# Patient Record
Sex: Male | Born: 1970 | Race: Black or African American | Hispanic: No | Marital: Single | Smoking: Never smoker
Health system: Southern US, Community
[De-identification: ages and names within clinical notes are randomized; demographics above are authoritative.]

## PROBLEM LIST (undated history)

## (undated) DIAGNOSIS — E119 Type 2 diabetes mellitus without complications: Secondary | ICD-10-CM

## (undated) DIAGNOSIS — I10 Essential (primary) hypertension: Secondary | ICD-10-CM

## (undated) DIAGNOSIS — E669 Obesity, unspecified: Secondary | ICD-10-CM

## (undated) HISTORY — PX: KNEE ARTHROSCOPY: SUR90

---

## 1998-05-24 ENCOUNTER — Emergency Department (HOSPITAL_COMMUNITY): Admission: EM | Admit: 1998-05-24 | Discharge: 1998-05-24 | Payer: Self-pay | Admitting: Emergency Medicine

## 1998-06-01 ENCOUNTER — Emergency Department (HOSPITAL_COMMUNITY): Admission: EM | Admit: 1998-06-01 | Discharge: 1998-06-01 | Payer: Self-pay | Admitting: *Deleted

## 2002-01-24 ENCOUNTER — Emergency Department (HOSPITAL_COMMUNITY): Admission: EM | Admit: 2002-01-24 | Discharge: 2002-01-24 | Payer: Self-pay | Admitting: Emergency Medicine

## 2002-01-24 ENCOUNTER — Encounter: Payer: Self-pay | Admitting: Emergency Medicine

## 2002-12-24 ENCOUNTER — Encounter: Payer: Self-pay | Admitting: Emergency Medicine

## 2002-12-24 ENCOUNTER — Emergency Department (HOSPITAL_COMMUNITY): Admission: EM | Admit: 2002-12-24 | Discharge: 2002-12-24 | Payer: Self-pay | Admitting: Emergency Medicine

## 2003-02-26 ENCOUNTER — Emergency Department (HOSPITAL_COMMUNITY): Admission: EM | Admit: 2003-02-26 | Discharge: 2003-02-26 | Payer: Self-pay | Admitting: Emergency Medicine

## 2003-05-13 ENCOUNTER — Emergency Department (HOSPITAL_COMMUNITY): Admission: EM | Admit: 2003-05-13 | Discharge: 2003-05-14 | Payer: Self-pay | Admitting: Emergency Medicine

## 2003-05-13 ENCOUNTER — Encounter: Payer: Self-pay | Admitting: Emergency Medicine

## 2012-09-04 ENCOUNTER — Emergency Department (HOSPITAL_COMMUNITY)
Admission: EM | Admit: 2012-09-04 | Discharge: 2012-09-04 | Disposition: A | Discharge status: Home / Self Care | Payer: Self-pay | Source: Home / Self Care | Attending: Emergency Medicine | Admitting: Emergency Medicine

## 2012-09-04 NOTE — ED Notes (Signed)
Pt called again for examination; unavailable in waiting area (x2)

## 2012-09-04 NOTE — ED Notes (Signed)
Pt called in all waiting area; no answer x 1

## 2013-06-04 ENCOUNTER — Encounter (HOSPITAL_COMMUNITY): Payer: Self-pay | Admitting: Emergency Medicine

## 2013-06-04 ENCOUNTER — Emergency Department (HOSPITAL_COMMUNITY)
Admission: EM | Admit: 2013-06-04 | Discharge: 2013-06-04 | Disposition: A | Discharge status: Home Health | Payer: Self-pay | Attending: Emergency Medicine | Admitting: Emergency Medicine

## 2013-06-04 DIAGNOSIS — E669 Obesity, unspecified: Secondary | ICD-10-CM | POA: Insufficient documentation

## 2013-06-04 DIAGNOSIS — L723 Sebaceous cyst: Secondary | ICD-10-CM | POA: Insufficient documentation

## 2013-06-04 DIAGNOSIS — I10 Essential (primary) hypertension: Secondary | ICD-10-CM | POA: Insufficient documentation

## 2013-06-04 DIAGNOSIS — L729 Follicular cyst of the skin and subcutaneous tissue, unspecified: Secondary | ICD-10-CM

## 2013-06-04 HISTORY — DX: Obesity, unspecified: E66.9

## 2013-06-04 HISTORY — DX: Essential (primary) hypertension: I10

## 2013-06-04 NOTE — Discharge Instructions (Signed)
Follow up with general surgery.  Please return to the ER if you develop fever, severe pain, redness or drainage at site of skin tag.

## 2013-06-04 NOTE — ED Notes (Signed)
PT. REPORTS SKIN TAG AT BUTTOCKS THAT IS INCREASING IN SIZE FOR SEVERAL MONTHS , DENIES DRAINAGE , SLIGHT PAIN .

## 2013-06-05 NOTE — ED Provider Notes (Signed)
Medical screening examination/treatment/procedure(s) were performed by non-physician practitioner and as supervising physician I was immediately available for consultation/collaboration.  Lillan Mccreadie R. Shatori Bertucci, MD 06/05/13 2335 

## 2013-06-05 NOTE — ED Provider Notes (Signed)
  CSN: [id number]     Arrival date & time 06/04/13  2054 History     First MD Initiated Contact with Patient 06/04/13 2201     Chief Complaint  Patient presents with  . Skin Problem   (Consider location/radiation/quality/duration/timing/severity/associated sxs/prior Treatment) HPI History provided by pt.   Pt reports that he has had a skin tag on perineum for the past 2-3 months.  In the last two days, it has grown rapidly in size.  Denies pain, but states that he is aware of it when he sits now.  No fever, abd pain, N/V/D, urinary sx, drainage from skin tag and rash.  Denies trauma to area.   Past Medical History  Diagnosis Date  . Obesity   . Hypertension    Past Surgical History  Procedure Laterality Date  . Knee arthroscopy     No family history on file. History  Substance Use Topics  . Smoking status: Never Smoker   . Smokeless tobacco: Not on file  . Alcohol Use: Yes    Review of Systems  All other systems reviewed and are negative.    Allergies  Review of patient's allergies indicates no known allergies.  Home Medications  No current outpatient prescriptions on file. BP 153/96  Pulse 103  Temp(Src) 99.9 F (37.7 C) (Oral)  Resp 14  SpO2 99% Physical Exam  Nursing note and vitals reviewed. Constitutional: He is oriented to person, place, and time. He appears well-developed and well-nourished. No distress.  HENT:  Head: Normocephalic and atraumatic.  Eyes:  Normal appearance  Neck: Normal range of motion.  Pulmonary/Chest: Effort normal.  Genitourinary:  No rash of genitalia.  Testicles descended bilaterally and are non-tender.  No urethral discharge.    Musculoskeletal: Normal range of motion.  Neurological: He is alert and oriented to person, place, and time.  Skin:  There is a 3cm long pedunculated, baggy, cystic structure right anterior perineum.  No overlying skin changes or surrounding erythema.  No drainage.  No tenderness.  Can not be reduced.   Psychiatric: He has a normal mood and affect. His behavior is normal.    ED Course   Procedures (including critical care time)  Labs Reviewed - No data to display No results found. 1. Cyst of skin     MDM  41yo M presents w/ large cystic structure of perineum.  No sign of infectious process and does not appear to be a hernia.   Pt reassured and referred to general surgery.  Dr. Rubin Payor has examined and agreess with plan.  Return precautions discussed.   Otilio Miu, PA-C 06/05/13 [phone]

## 2014-05-09 ENCOUNTER — Emergency Department (HOSPITAL_COMMUNITY)
Admission: EM | Admit: 2014-05-09 | Discharge: 2014-05-09 | Disposition: A | Discharge status: Home / Self Care | Payer: Self-pay | Attending: Emergency Medicine | Admitting: Emergency Medicine

## 2014-05-09 ENCOUNTER — Encounter (HOSPITAL_COMMUNITY): Payer: Self-pay | Admitting: Emergency Medicine

## 2014-05-09 DIAGNOSIS — I10 Essential (primary) hypertension: Secondary | ICD-10-CM | POA: Insufficient documentation

## 2014-05-09 DIAGNOSIS — E669 Obesity, unspecified: Secondary | ICD-10-CM | POA: Insufficient documentation

## 2014-05-09 DIAGNOSIS — Z202 Contact with and (suspected) exposure to infections with a predominantly sexual mode of transmission: Secondary | ICD-10-CM

## 2014-05-09 MED ORDER — METRONIDAZOLE 500 MG PO TABS
2000.0000 mg | ORAL_TABLET | Freq: Once | ORAL | Status: AC
  Administered 2014-05-09: 2000 mg via ORAL
  Filled 2014-05-09: qty 4

## 2014-05-09 NOTE — ED Notes (Signed)
Notes triage were by chric Demaya Hardge rn not sabtrina newsome

## 2014-05-09 NOTE — ED Provider Notes (Signed)
CSN: [id number]     Arrival date & time 05/09/14  2036 History   First MD Initiated Contact with Patient 05/09/14 2300     Chief Complaint  Patient presents with  . Exposure to STD     (Consider location/radiation/quality/duration/timing/severity/associated sxs/prior Treatment) HPI Comments: Patient is a 43 year old male with history of hypertension who presents today after being told by his girlfriend that she has trichomonas. He states that he otherwise feels well. He denies any penile discharge, dysuria, urinary frequency, urinary urgency, abdominal pain. He has not had this issue in the past. The patient is open to HIV, syphilis, gonorrhea, Chlamydia testing.  The history is provided by the patient. No language interpreter was used.    Past Medical History  Diagnosis Date  . Obesity   . Hypertension    Past Surgical History  Procedure Laterality Date  . Knee arthroscopy     No family history on file. History  Substance Use Topics  . Smoking status: Never Smoker   . Smokeless tobacco: Not on file  . Alcohol Use: Yes    Review of Systems  Constitutional: Negative for fever and chills.  Respiratory: Negative for shortness of breath.   Cardiovascular: Negative for chest pain.  Gastrointestinal: Negative for nausea, vomiting and abdominal pain.  Genitourinary: Negative for dysuria, urgency, frequency, hematuria, flank pain, discharge, penile swelling, scrotal swelling, difficulty urinating, genital sores, penile pain and testicular pain.       Exposure to STD  All other systems reviewed and are negative.     Allergies  Review of patient's allergies indicates no known allergies.  Home Medications   Prior to Admission medications   Not on File   BP 134/82  Pulse 71  Temp(Src) 98.8 F (37.1 C) (Oral)  Resp 18  Ht 5\' 11"  (1.803 m)  Wt 281 lb (127.461 kg)  BMI 39.21 kg/m2  SpO2 99% Physical Exam  Nursing note and vitals reviewed. Constitutional: He is oriented  to person, place, and time. He appears well-developed and well-nourished. No distress.  Obese  HENT:  Head: Normocephalic and atraumatic.  Right Ear: External ear normal.  Left Ear: External ear normal.  Nose: Nose normal.  Eyes: Conjunctivae are normal.  Neck: Normal range of motion. No tracheal deviation present.  Cardiovascular: Normal rate, regular rhythm and normal heart sounds.   Pulmonary/Chest: Effort normal and breath sounds normal. No stridor.  Abdominal: Soft. He exhibits no distension. There is no tenderness.  Genitourinary: Testes normal and penis normal. Right testis shows no mass, no swelling and no tenderness. Right testis is descended. Left testis shows no mass, no swelling and no tenderness. Left testis is descended. No phimosis, paraphimosis, hypospadias, penile erythema or penile tenderness. No discharge found.  Musculoskeletal: Normal range of motion.  Neurological: He is alert and oriented to person, place, and time.  Skin: Skin is warm and dry. He is not diaphoretic.  Psychiatric: He has a normal mood and affect. His behavior is normal.    ED Course  Procedures (including critical care time) Labs Review Labs Reviewed  GC/CHLAMYDIA PROBE AMP  RPR  HIV ANTIBODY (ROUTINE TESTING)    Imaging Review No results found.   EKG Interpretation None      MDM   Final diagnoses:  Exposure to STD    Patient presents to the emergency department after being exposed to trichomonas. He was treated in the ED with 2000 mg of Flagyl. He is asymptomatic. HIV, RPR, GC, chlamydia are pending. Discussed  with patient that he will receive a phone call if any of these tests are positive. Explained to patient he should avoid alcohol tonight as he can have a bad reaction to Flagyl. Discussed reasons to return to the emergency department. Vital signs stable for discharge. Patient / Family / Caregiver informed of clinical course, understand medical decision-making process, and agree  with plan.    Mora BellmanHannah S Elman Dettman, PA-C 05/09/14 2321

## 2014-05-09 NOTE — Discharge Instructions (Signed)
Sexually Transmitted Disease °A sexually transmitted disease (STD) is a disease or infection often passed to another person during sex. However, STDs can be passed through nonsexual ways. An STD can be passed through: °· Spit (saliva). °· Semen. °· Blood. °· Mucus from the vagina. °· Pee (urine). °HOW CAN I LESSEN MY CHANCES OF GETTING AN STD? °· Use: °¨ Latex condoms. °¨ Water-soluble lubricants with condoms. Do not use petroleum jelly or oils. °¨ Dental dams. These are small pieces of latex that are used as a barrier during oral sex. °· Avoid having more than one sex partner. °· Do not have sex with someone who has other sex partners. °· Do not have sex with anyone you do not know or who is at high risk for an STD. °· Avoid risky sex that can break your skin. °· Do not have sex if you have open sores on your mouth or skin. °· Avoid drinking too much alcohol or taking illegal drugs. Alcohol and drugs can affect your good judgment. °· Avoid oral and anal sex acts. °· Get shots (vaccines) for HPV and hepatitis. °· If you are at risk of being infected with HIV, it is advised that you take a certain medicine daily to prevent HIV infection. This is called pre-exposure prophylaxis (PrEP). You may be at risk if: °¨ You are a man who has sex with other men (MSM). °¨ You are attracted to the opposite sex (heterosexual) and are having sex with more than one partner. °¨ You take drugs with a needle. °¨ You have sex with someone who has HIV. °· Talk with your doctor about if you are at high risk of being infected with HIV. If you begin to take PrEP, get tested for HIV first. Get tested every 3 months for as long as you are taking PrEP. °WHAT SHOULD I DO IF I THINK I HAVE AN STD? °· See your doctor. °· Tell your sex partner(s) that you have an STD. They should be tested and treated. °· Do not have sex until your doctor says it is okay. °WHEN SHOULD I GET HELP? °Get help right away if: °· You have bad belly (abdominal)  pain. °· You are a man and have puffiness (swelling) or pain in your testicles. °· You are a woman and have puffiness in your vagina. °Document Released: 11/23/2004 Document Revised: 10/21/2013 Document Reviewed: 04/11/2013 °ExitCare® Patient Information ©2015 [employer]. This information is not intended to replace advice given to you by your health care provider. Make sure you discuss any questions you have with your health care provider. ° ° °Emergency Department Resource Guide °1) Find a Doctor and Pay Out of Pocket °Although you won't have to find out who is covered by your insurance plan, it is a good idea to ask around and get recommendations. You will then need to call the office and see if the doctor you have chosen will accept you as a new patient and what types of options they offer for patients who are self-pay. Some doctors offer discounts or will set up payment plans for their patients who do not have insurance, but you will need to ask so you aren't surprised when you get to your appointment. ° °2) Contact Your Local Health Department °Not all health departments have doctors that can see patients for sick visits, but many do, so it is worth a call to see if yours does. If you don't know where your local health department is, you can   check in your phone book. The CDC also has a tool to help you locate your state's health department, and many state websites also have listings of all of their local health departments. ° °3) Find a Walk-in Clinic °If your illness is not likely to be very severe or complicated, you may want to try a walk in clinic. These are popping up all over the country in pharmacies, drugstores, and shopping centers. They're usually staffed by nurse practitioners or physician assistants that have been trained to treat common illnesses and complaints. They're usually fairly quick and inexpensive. However, if you have serious medical issues or chronic medical problems, these are  probably not your best option. ° °No Primary Care Doctor: °- Call [facility] at  832-8000 - they can help you locate a primary care doctor that  accepts your insurance, provides certain services, etc. °- Physician Referral Service- 1-800-533-3463 ° °Chronic Pain Problems: °Organization         Address  Phone   Notes  °[facility]  (336) 297-2271 Patients need to be referred by their primary care doctor.  ° °Medication Assistance: °Organization         Address  Phone   Notes  °[place] [facility] [address] °[place], [place] [address] (336) 641-8030 --Must be a resident of [place] °-- Must have NO insurance coverage whatsoever (no Medicaid/ Medicare, etc.) °-- The pt. MUST have a primary care doctor that directs their care regularly and follows them in the community °  °[facility]  (866) 331-1348   °[employer]  (888) 892-1162   ° °Agencies that provide inexpensive medical care: °Organization         Address  Phone   Notes  °[facility]  (336) 832-8035   ° Internal Medicine    (336) 832-7272   °[facility] [address] °[place], [place] [address] (336) 832-4777   °[facility] [address], °[place] (336) 271-4999   °[facility]    (336) 373-0678   °[facility]    (336) 272-1050   °[facility] ° [address], [place] Phone:  (336) 832-4444, Fax:  (336) 832-4440 Hours of Operation:  9 am - 6 pm, M-F.  Also accepts Medicaid/Medicare and self-pay.  °[facility] ° [address], [place] Phone: (336) 832-3150, Fax: (336) 832-3151. Hours of Operation:  8:30 am - 5:30 pm, M-F.  Also accepts Medicaid and self-pay.  °[facility] [place] [address], [place] Phone: (336) 878-6027   °[facility] [address], [place], [place] (336)723-1848, Ext. 123 Mondays & Thursdays:  7-9 AM.  First 15 patients are seen on a first come, first serve basis. °  ° °Medicaid-accepting [place] Providers: ° °Organization         Address  Phone   Notes  °[facility] [address], [place] (336) 641-2100 Also accepts self-pay patients.  °[facility] [address], [place] ° (336) 856-9996   °[facility] [address], [place] (336) 288-8857   °[facility] [address] [place] [address], [place] (336) 299-7000   °Veita Bland [address], [place]  ° (336) 373-1557 Only accepts [place] Access Medicaid patients after they have their name applied to   their card.  ° °Self-Pay (no insurance) in [place]: ° °Organization         Address  Phone   Notes  °Sickle Cell Patients, [facility] [address], [place] (336) 832-1970   °[facility] [address], [place] (336) 832-4400   ° [facility] ° [address], [place] (336) 992-4800   °[facility]/Dr. Osei-Bonsu ° [address] [place] [address], [place] or [address], [place] (336) 841-8500 Phone number for both [place] and [place] locations is the same.  °[facility] [address], [place] (336) 299-0000   °[facility] [address] [place] [address], [place] or [address] (336) 852-7530 °(336) 878-2260   °[facility] [address], [place] (336) 350-1642, phone; (336) 294-5005, fax Sees patients 1st and 3rd Saturday of every month.  Must not qualify for public or private insurance (i.e. Medicaid, Medicare, [place] Health Choice, Veterans' Benefits) • Household income should be no more than 200% of the poverty level •The clinic cannot treat you if you are pregnant or think you are pregnant • Sexually transmitted diseases are not treated at the clinic.   ° ° °Dental Care: °Organization         Address  Phone  Notes  °[facility] [address], [place] (336) 641-6152 Accepts children up to age 21 who are enrolled in Medicaid or [place] Health Choice; pregnant women with a Medicaid card; and children who have applied for Medicaid or [place] Health Choice, but were declined, whose parents can pay a reduced fee at time of service.  °[facility] [place]  [address], [place] (336) 641-7733 Accepts children up to age 21 who are enrolled in Medicaid or [place] Health Choice; pregnant women with a Medicaid card; and children who have applied for Medicaid or [place] Health Choice, but were declined, whose parents can pay a reduced fee at time of service.  °[facility] ° [address], [place] (336) 641-4533 Patients are seen by appointment only. Walk-ins are not accepted. [facility] will see patients 18 years of age and older. °Monday - Tuesday (8am-5pm) °Most Wednesdays (8:30-5pm) °$30 per visit, cash only  °[facility] ° [address], [place] (336) 641-4533 Patients are seen by appointment only. Walk-ins are not accepted. [facility] will see patients 18 years of age and older. °One Wednesday Evening (Monthly: Volunteer Based).  $30 per visit, cash only  °[facility]  (919) 537-3737 for adults; Children under age 4, call Graduate Pediatric Dentistry at (919) [phone]. Children aged 4-14, please call (919) [phone] to request a pediatric application. ° Dental services are provided in all areas of dental care including fillings, crowns and bridges, complete and partial dentures, implants, gum treatment, root canals, and extractions. Preventive care is also provided. Treatment is provided to both adults and children. °Patients are selected via a lottery and there is often a waiting  list. °  °[facility] [address], °[place] ° (336) 763-8833 [email or web address] °  °[facility] [address], [place],  (336)723-1848, Ext. 123 Second and Fourth Thursday of each month, opens at 6:30 AM; Clinic ends at 9 AM.  Patients are seen on a first-come first-served basis,   and a limited number are seen during each clinic.  ° °[facility] ° [address], [place], [place] (336) 723-7904   Eligibility Requirements °You must have lived in [place], [place], or [place] counties for at least the last three months. °  You cannot be eligible for state or federal sponsored healthcare insurance, including Veterans Administration, Medicaid, or Medicare. °  You generally cannot be eligible for healthcare insurance through your employer.  °  How to apply: °Eligibility screenings are held every Tuesday and Wednesday afternoon from 1:00 pm until 4:00 pm. You do not need an appointment for the interview!  °[facility] [address], [place], [place] 336-631-2330   °[facility]  336-342-8273   °[facility]  336-703-3100   °[facility]  336-570-6415   ° °Behavioral Health Resources in the Community: °Intensive Outpatient Programs °Organization         Address  Phone  Notes  °[facility] [address], [place], [place] 336-878-6098   °[facility] [address], [place], [place] 336-832-9800   °[facility] [address], [place], [place] ° 336-882-2125   °[facility] [address],  °[place], [place] 1-800-853-5163 or 336-641-4981   °Substance Abuse Resources °Organization         Address  Phone  Notes  °[facility]  336-882-2125   °[facility]  336-784-9470   °[facility]  336-285-9073   °[facility]  336-845-3988   °Residential & Outpatient Substance Abuse Program   1-800-659-3381   °Psychological Services °Organization         Address  Phone  Notes  °[facility]  336- 832-9600   °[facility]  336- 378-7881   °[facility] [address], [place] 1-800-853-5163 or 336-641-4981   ° °Mobile Crisis Teams °Organization         Address  Phone  Notes  °[facility], Mobile Crisis Care Unit  1-877-626-1772   °Assertive °[facility] ° [address] [place], [place] 336-834-9664   °Sharon DeEsch [address] °[place] 336-554-5454   ° °Self-Help/Support Groups °Organization         Address  Phone             Notes  °[facility] - variety of support groups  336- 373-1402 Call for more information  °Narcotics Anonymous (NA), [facility] [address], °[place]  2 meetings at this location  ° °Residential Treatment Programs °Organization         Address  Phone  Notes  °[facility] [address],    °[place]  1-866-801-8205   °[facility] ° [address], [place], [place] 704-293-8524   °[facility] [address], [place] 336-845-3988 Admissions: 8am-3pm M-F  °[facility] [address]    °[place], [place] 336-841-1104   °[facility] [address], [place], [place] 336-379-7146   °[facility] [address]  °[place], [place] 336-285-9073   °[facility] - Intensive Outpatient [address], [place], [place] 336-852-3033   °[facility] [address]  °[place], [place] 1-877-615-2722 or 336-784-9470   °[facility] [address] [place], [place] 336-227-7417 Accepts Medicaid  °[facility] [address]  °[place] 1-800-659-3381 Substance Abuse/Addiction Treatment  ° °[place] Behavioral Health Resources °Organization          Address  Phone  Notes  °[facility]  (888) 581-9988   °Julie Brannon, PhD [address] [place], [place]   (336) 349-5553 or (336) 951-0000   °[facility]   [address] °[place], [place] (336) 349-4454   °[facility] [address], [place], [place] (336) 342-8316 Insurance/Medicaid/sponsorship through [facility]  °[facility] [address]                                    [place], [place] (336) 342-8316 Therapy/tele-psych/case  °[facility] [address]  ° [place], [place] (336) 349-2233    °Dr. Arfeen  (336) 349-4544   °[facility] [place]  [employer] [place] [facility] 1) [address], [place] °2) [address], [place] °3)  [address], [place] (336) 349-3220 °(336) 342-7768 ° °(336) 342-8140   °[place] [facility] (336) 342-1394 or (336) 342-3537 (After Hours)    ° ° ° °

## 2014-05-09 NOTE — ED Notes (Signed)
The pt was called by his girlfriend that told him she has been checked and was diagnosed with trich.  hes here  To be checked

## 2014-05-10 LAB — RPR

## 2014-05-10 LAB — HIV ANTIBODY (ROUTINE TESTING W REFLEX): HIV 1&2 Ab, 4th Generation: NONREACTIVE

## 2014-05-10 NOTE — ED Provider Notes (Signed)
Medical screening examination/treatment/procedure(s) were performed by non-physician practitioner and as supervising physician I was immediately available for consultation/collaboration.   EKG Interpretation None       Doug SouSam Raphaella Larkin, MD 05/10/14 [phone]

## 2014-05-11 LAB — GC/CHLAMYDIA PROBE AMP
CT Probe RNA: NEGATIVE
GC Probe RNA: NEGATIVE

## 2014-12-10 ENCOUNTER — Emergency Department (HOSPITAL_BASED_OUTPATIENT_CLINIC_OR_DEPARTMENT_OTHER)
Admission: EM | Admit: 2014-12-10 | Discharge: 2014-12-10 | Disposition: A | Discharge status: Home / Self Care | Payer: Self-pay | Attending: Emergency Medicine | Admitting: Emergency Medicine

## 2014-12-10 DIAGNOSIS — X58XXXA Exposure to other specified factors, initial encounter: Secondary | ICD-10-CM | POA: Insufficient documentation

## 2014-12-10 DIAGNOSIS — Y9289 Other specified places as the place of occurrence of the external cause: Secondary | ICD-10-CM | POA: Insufficient documentation

## 2014-12-10 DIAGNOSIS — Y9389 Activity, other specified: Secondary | ICD-10-CM | POA: Insufficient documentation

## 2014-12-10 DIAGNOSIS — I10 Essential (primary) hypertension: Secondary | ICD-10-CM | POA: Insufficient documentation

## 2014-12-10 DIAGNOSIS — M5441 Lumbago with sciatica, right side: Secondary | ICD-10-CM

## 2014-12-10 DIAGNOSIS — Y998 Other external cause status: Secondary | ICD-10-CM | POA: Insufficient documentation

## 2014-12-10 DIAGNOSIS — S3992XA Unspecified injury of lower back, initial encounter: Secondary | ICD-10-CM | POA: Insufficient documentation

## 2014-12-10 DIAGNOSIS — E669 Obesity, unspecified: Secondary | ICD-10-CM | POA: Insufficient documentation

## 2014-12-10 MED ORDER — HYDROCODONE-ACETAMINOPHEN 5-325 MG PO TABS: 1.0000 | ORAL_TABLET | ORAL | Status: DC | PRN

## 2014-12-10 MED ORDER — NAPROXEN 500 MG PO TABS: 500.0000 mg | ORAL_TABLET | Freq: Two times a day (BID) | ORAL | Status: DC

## 2014-12-10 MED ORDER — CYCLOBENZAPRINE HCL 10 MG PO TABS: 10.0000 mg | ORAL_TABLET | Freq: Three times a day (TID) | ORAL | Status: DC | PRN

## 2014-12-10 NOTE — ED Provider Notes (Signed)
CSN: [id number]     Arrival date & time 12/10/14  1745 History   First MD Initiated Contact with Patient 12/10/14 1754     Chief Complaint  Patient presents with  . Back Pain     (Consider location/radiation/quality/duration/timing/severity/associated sxs/prior Treatment) HPI Comments: 44 year old male with a past medical history of hypertension presenting with low back pain 1 week. Patient reports he was sitting in a chair, with his wallet in his right back pocket, when he stood up and suddenly felt a pain through his right buttock down to his right mid thigh. Pain has been intermittent since, worse when sitting for long period of time. Denies numbness or tingling down his extremities. Denies fever, chills or night sweats. No loss of control bowels or bladder or saddle anesthesia.  Patient is a 44 y.o. male presenting with back pain. The history is provided by the patient.  Back Pain   Past Medical History  Diagnosis Date  . Obesity   . Hypertension    Past Surgical History  Procedure Laterality Date  . Knee arthroscopy     No family history on file. History  Substance Use Topics  . Smoking status: Never Smoker   . Smokeless tobacco: Not on file  . Alcohol Use: Yes    Review of Systems  Musculoskeletal: Positive for back pain.  All other systems reviewed and are negative.     Allergies  Review of patient's allergies indicates no known allergies.  Home Medications   Prior to Admission medications   Medication Sig Start Date End Date Taking? Authorizing Provider  cyclobenzaprine (FLEXERIL) 10 MG tablet Take 1 tablet (10 mg total) by mouth every 8 (eight) hours as needed for muscle spasms. 12/10/14   Kathrynn Speed, PA-C  HYDROcodone-acetaminophen (NORCO/VICODIN) 5-325 MG per tablet Take 1-2 tablets by mouth every 4 (four) hours as needed. 12/10/14   Shailen Thielen M Cystal Shannahan, PA-C  naproxen (NAPROSYN) 500 MG tablet Take 1 tablet (500 mg total) by mouth 2 (two) times daily. 12/10/14    Alsha Meland M Tra Wilemon, PA-C   BP 129/81 mmHg  Pulse 89  Temp(Src) 98.4 F (36.9 C) (Oral)  Resp 18  Ht  (1.803 m)  Wt 270 lb (122.471 kg)  BMI 37.67 kg/m2  SpO2 100% Physical Exam  Constitutional: He is oriented to person, place, and time. He appears well-developed and well-nourished. No distress.  HENT:  Head: Normocephalic and atraumatic.  Mouth/Throat: Oropharynx is clear and moist.  Eyes: Conjunctivae are normal.  Neck: Normal range of motion. Neck supple. No spinous process tenderness and no muscular tenderness present.  Cardiovascular: Normal rate, regular rhythm and normal heart sounds.   Pulmonary/Chest: Effort normal and breath sounds normal. No respiratory distress.  Musculoskeletal: He exhibits no edema.  TTP R lumbar paraspinal muscles with spasm. No spinous process tenderness. Positive SLR on right.  Neurological: He is alert and oriented to person, place, and time. He has normal strength.  Strength lower extremities 5/5 and equal bilateral. Sensation intact. Normal gait.  Skin: Skin is warm and dry. No rash noted. He is not diaphoretic.  Psychiatric: He has a normal mood and affect. His behavior is normal.  Nursing note and vitals reviewed.   ED Course  Procedures (including critical care time) Labs Review Labs Reviewed - No data to display  Imaging Review No results found.   EKG Interpretation None      MDM   Final diagnoses:  Right-sided low back pain with right-sided sciatica  NAD. AFVSS. No red flags concerning patient's back pain. No s/s of central cord compression or cauda equina. Lower extremities are neurovascularly intact and patient is ambulating without difficulty. Treat with naproxen, vicodin (#6), flexeril. Rest, ice/heat. Stable for d/c. Return precautions given. Patient states understanding of treatment care plan and is agreeable.  Kathrynn SpeedRobyn M Shya Kovatch, PA-C 12/10/14 1830  Tilden FossaElizabeth Rees, MD 12/10/14 Nicholos Johns1907

## 2014-12-10 NOTE — ED Notes (Signed)
C/o low right back pain to mid right thigh. Onset 1 week ago. States he sitting in a chair and turned to pick up phone and felt pain in back.

## 2014-12-10 NOTE — Discharge Instructions (Signed)
Take Vicodin for severe pain only. No driving or operating heavy machinery while taking vicodin. This medication may cause drowsiness. No driving or operating heavy machinery while taking flexeril. This medication may make you drowsy. Take naproxen as prescribed. Rest, apply ice intermittently for the next 24 hours followed by heat. Avoid heavy lifting or hard physical activity.  Sciatica Sciatica is pain, weakness, numbness, or tingling along the path of the sciatic nerve. The nerve starts in the lower back and runs down the back of each leg. The nerve controls the muscles in the lower leg and in the back of the knee, while also providing sensation to the back of the thigh, lower leg, and the sole of your foot. Sciatica is a symptom of another medical condition. For instance, nerve damage or certain conditions, such as a herniated disk or bone spur on the spine, pinch or put pressure on the sciatic nerve. This causes the pain, weakness, or other sensations normally associated with sciatica. Generally, sciatica only affects one side of the body. CAUSES   Herniated or slipped disc.  Degenerative disk disease.  A pain disorder involving the narrow muscle in the buttocks (piriformis syndrome).  Pelvic injury or fracture.  Pregnancy.  Tumor (rare). SYMPTOMS  Symptoms can vary from mild to very severe. The symptoms usually travel from the low back to the buttocks and down the back of the leg. Symptoms can include:  Mild tingling or dull aches in the lower back, leg, or hip.  Numbness in the back of the calf or sole of the foot.  Burning sensations in the lower back, leg, or hip.  Sharp pains in the lower back, leg, or hip.  Leg weakness.  Severe back pain inhibiting movement. These symptoms may get worse with coughing, sneezing, laughing, or prolonged sitting or standing. Also, being overweight may worsen symptoms. DIAGNOSIS  Your caregiver will perform a physical exam to look for  common symptoms of sciatica. He or she may ask you to do certain movements or activities that would trigger sciatic nerve pain. Other tests may be performed to find the cause of the sciatica. These may include:  Blood tests.  X-rays.  Imaging tests, such as an MRI or CT scan. TREATMENT  Treatment is directed at the cause of the sciatic pain. Sometimes, treatment is not necessary and the pain and discomfort goes away on its own. If treatment is needed, your caregiver may suggest:  Over-the-counter medicines to relieve pain.  Prescription medicines, such as anti-inflammatory medicine, muscle relaxants, or narcotics.  Applying heat or ice to the painful area.  Steroid injections to lessen pain, irritation, and inflammation around the nerve.  Reducing activity during periods of pain.  Exercising and stretching to strengthen your abdomen and improve flexibility of your spine. Your caregiver may suggest losing weight if the extra weight makes the back pain worse.  Physical therapy.  Surgery to eliminate what is pressing or pinching the nerve, such as a bone spur or part of a herniated disk. HOME CARE INSTRUCTIONS   Only take over-the-counter or prescription medicines for pain or discomfort as directed by your caregiver.  Apply ice to the affected area for 20 minutes, 3-4 times a day for the first 48-72 hours. Then try heat in the same way.  Exercise, stretch, or perform your usual activities if these do not aggravate your pain.  Attend physical therapy sessions as directed by your caregiver.  Keep all follow-up appointments as directed by your caregiver.  Do  not wear high heels or shoes that do not provide proper support.  Check your mattress to see if it is too soft. A firm mattress may lessen your pain and discomfort. SEEK IMMEDIATE MEDICAL CARE IF:   You lose control of your bowel or bladder (incontinence).  You have increasing weakness in the lower back, pelvis, buttocks, or  legs.  You have redness or swelling of your back.  You have a burning sensation when you urinate.  You have pain that gets worse when you lie down or awakens you at night.  Your pain is worse than you have experienced in the past.  Your pain is lasting longer than 4 weeks.  You are suddenly losing weight without reason. MAKE SURE YOU:  Understand these instructions.  Will watch your condition.  Will get help right away if you are not doing well or get worse. Document Released: 10/10/2001 Document Revised: 04/16/2012 Document Reviewed: 02/25/2012 [facility] Patient Information 2015 [place], [place]. This information is not intended to replace advice given to you by your health care provider. Make sure you discuss any questions you have with your health care provider.  Radicular Pain Radicular pain in either the arm or leg is usually from a bulging or herniated disk in the spine. A piece of the herniated disk may press against the nerves as the nerves exit the spine. This causes pain which is felt at the tips of the nerves down the arm or leg. Other causes of radicular pain may include:  Fractures.  Heart disease.  Cancer.  An abnormal and usually degenerative state of the nervous system or nerves (neuropathy). Diagnosis may require CT or MRI scanning to determine the primary cause.  Nerves that start at the neck (nerve roots) may cause radicular pain in the outer shoulder and arm. It can spread down to the thumb and fingers. The symptoms vary depending on which nerve root has been affected. In most cases radicular pain improves with conservative treatment. Neck problems may require physical therapy, a neck collar, or cervical traction. Treatment may take many weeks, and surgery may be considered if the symptoms do not improve.  Conservative treatment is also recommended for sciatica. Sciatica causes pain to radiate from the lower back or buttock area down the leg into the foot. Often  there is a history of back problems. Most patients with sciatica are better after 2 to 4 weeks of rest and other supportive care. Short term bed rest can reduce the disk pressure considerably. Sitting, however, is not a good position since this increases the pressure on the disk. You should avoid bending, lifting, and all other activities which make the problem worse. Traction can be used in severe cases. Surgery is usually reserved for patients who do not improve within the first months of treatment. Only take over-the-counter or prescription medicines for pain, discomfort, or fever as directed by your caregiver. Narcotics and muscle relaxants may help by relieving more severe pain and spasm and by providing mild sedation. Cold or massage can give significant relief. Spinal manipulation is not recommended. It can increase the degree of disc protrusion. Epidural steroid injections are often effective treatment for radicular pain. These injections deliver medicine to the spinal nerve in the space between the protective covering of the spinal cord and back bones (vertebrae). Your caregiver can give you more information about steroid injections. These injections are most effective when given within two weeks of the onset of pain.  You should see your caregiver  for follow up care as recommended. A program for neck and back injury rehabilitation with stretching and strengthening exercises is an important part of management.  SEEK IMMEDIATE MEDICAL CARE IF:  You develop increased pain, weakness, or numbness in your arm or leg.  You develop difficulty with bladder or bowel control.  You develop abdominal pain. Document Released: 11/23/2004 Document Revised: 01/08/2012 Document Reviewed: 02/08/2009 [facility] Patient Information 2015 [place], [place]. This information is not intended to replace advice given to you by your health care provider. Make sure you discuss any questions you have with your health care  provider.

## 2016-05-20 ENCOUNTER — Emergency Department (HOSPITAL_COMMUNITY)
Admission: EM | Admit: 2016-05-20 | Discharge: 2016-05-20 | Disposition: A | Discharge status: Home / Self Care | Payer: Self-pay | Attending: Physician Assistant | Admitting: Physician Assistant

## 2016-05-20 ENCOUNTER — Encounter (HOSPITAL_COMMUNITY): Payer: Self-pay | Admitting: *Deleted

## 2016-05-20 DIAGNOSIS — I10 Essential (primary) hypertension: Secondary | ICD-10-CM | POA: Insufficient documentation

## 2016-05-20 DIAGNOSIS — Z202 Contact with and (suspected) exposure to infections with a predominantly sexual mode of transmission: Secondary | ICD-10-CM | POA: Insufficient documentation

## 2016-05-20 MED ORDER — LIDOCAINE HCL (PF) 1 % IJ SOLN
5.0000 mL | Freq: Once | INTRAMUSCULAR | Status: AC
  Administered 2016-05-20: 0.9 mL
  Filled 2016-05-20: qty 5

## 2016-05-20 MED ORDER — CEFTRIAXONE SODIUM 250 MG IJ SOLR
250.0000 mg | Freq: Once | INTRAMUSCULAR | Status: AC
  Administered 2016-05-20: 250 mg via INTRAMUSCULAR
  Filled 2016-05-20: qty 250

## 2016-05-20 MED ORDER — AZITHROMYCIN 1 G PO PACK
1.0000 g | PACK | Freq: Once | ORAL | Status: AC
  Administered 2016-05-20: 1 g via ORAL
  Filled 2016-05-20: qty 1

## 2016-05-20 MED ORDER — METRONIDAZOLE 500 MG PO TABS
2000.0000 mg | ORAL_TABLET | Freq: Once | ORAL | Status: AC
  Administered 2016-05-20: 2000 mg via ORAL
  Filled 2016-05-20: qty 4

## 2016-05-20 NOTE — Discharge Instructions (Signed)
Avoid intercourse for 1 week. Follow up with primary care doctor or health department for further treatment. Use safe sex precautionsSexually Transmitted Disease A sexually transmitted disease (STD) is a disease or infection that may be passed (transmitted) from person to person, usually during sexual activity. This may happen by way of saliva, semen, blood, vaginal mucus, or urine. Common STDs include:  Gonorrhea.  Chlamydia.  Syphilis.  HIV and AIDS.  Genital herpes.  Hepatitis B and C.  Trichomonas.  Human papillomavirus (HPV).  Pubic lice.  Scabies.  Mites.  Bacterial vaginosis. WHAT ARE CAUSES OF STDs? An STD may be caused by bacteria, a virus, or parasites. STDs are often transmitted during sexual activity if one person is infected. However, they may also be transmitted through nonsexual means. STDs may be transmitted after:   Sexual intercourse with an infected person.  Sharing sex toys with an infected person.  Sharing needles with an infected person or using unclean piercing or tattoo needles.  Having intimate contact with the genitals, mouth, or rectal areas of an infected person.  Exposure to infected fluids during birth. WHAT ARE THE SIGNS AND SYMPTOMS OF STDs? Different STDs have different symptoms. Some people may not have any symptoms. If symptoms are present, they may include:  Painful or bloody urination.  Pain in the pelvis, abdomen, vagina, anus, throat, or eyes.  A skin rash, itching, or irritation.  Growths, ulcerations, blisters, or sores in the genital and anal areas.  Abnormal vaginal discharge with or without bad odor.  Penile discharge in men.  Fever.  Pain or bleeding during sexual intercourse.  Swollen glands in the groin area.  Yellow skin and eyes (jaundice). This is seen with hepatitis.  Swollen testicles.  Infertility.  Sores and blisters in the mouth. HOW ARE STDs DIAGNOSED? To make a diagnosis, your health care  provider may:  Take a medical history.  Perform a physical exam.  Take a sample of any discharge to examine.  Swab the throat, cervix, opening to the penis, rectum, or vagina for testing.  Test a sample of your first morning urine.  Perform blood tests.  Perform a Pap test, if this applies.  Perform a colposcopy.  Perform a laparoscopy. HOW ARE STDs TREATED? Treatment depends on the STD. Some STDs may be treated but not cured.  Chlamydia, gonorrhea, trichomonas, and syphilis can be cured with antibiotic medicine.  Genital herpes, hepatitis, and HIV can be treated, but not cured, with prescribed medicines. The medicines lessen symptoms.  Genital warts from HPV can be treated with medicine or by freezing, burning (electrocautery), or surgery. Warts may come back.  HPV cannot be cured with medicine or surgery. However, abnormal areas may be removed from the cervix, vagina, or vulva.  If your diagnosis is confirmed, your recent sexual partners need treatment. This is true even if they are symptom-free or have a negative culture or evaluation. They should not have sex until their health care providers say it is okay.  Your health care provider may test you for infection again 3 months after treatment. HOW CAN I REDUCE MY RISK OF GETTING AN STD? Take these steps to reduce your risk of getting an STD:  Use latex condoms, dental dams, and water-soluble lubricants during sexual activity. Do not use petroleum jelly or oils.  Avoid having multiple sex partners.  Do not have sex with someone who has other sex partners  Do not have sex with anyone you do not know or who is at  high risk for an STD.  Avoid risky sex practices that can break your skin.  Do not have sex if you have open sores on your mouth or skin.  Avoid drinking too much alcohol or taking illegal drugs. Alcohol and drugs can affect your judgment and put you in a vulnerable position.  Avoid engaging in oral and anal  sex acts.  Get vaccinated for HPV and hepatitis. If you have not received these vaccines in the past, talk to your health care provider about whether one or both might be right for you.  If you are at risk of being infected with HIV, it is recommended that you take a prescription medicine daily to prevent HIV infection. This is called pre-exposure prophylaxis (PrEP). You are considered at risk if:  You are a man who has sex with other men (MSM).  You are a heterosexual man or woman and are sexually active with more than one partner.  You take drugs by injection.  You are sexually active with a partner who has HIV.  Talk with your health care provider about whether you are at high risk of being infected with HIV. If you choose to begin PrEP, you should first be tested for HIV. You should then be tested every 3 months for as long as you are taking PrEP. WHAT SHOULD I DO IF I THINK I HAVE AN STD?  See your health care provider.  Tell your sexual partner(s). They should be tested and treated for any STDs.  Do not have sex until your health care provider says it is okay. WHEN SHOULD I GET IMMEDIATE MEDICAL CARE? Contact your health care provider right away if:   You have severe abdominal pain.  You are a man and notice swelling or pain in your testicles.  You are a woman and notice swelling or pain in your vagina.   This information is not intended to replace advice given to you by your health care provider. Make sure you discuss any questions you have with your health care provider.   Document Released: 01/06/2003 Document Revised: 11/06/2014 Document Reviewed: 05/06/2013 Elsevier Interactive Patient Education [employer]

## 2016-05-20 NOTE — ED Notes (Signed)
Pt states he recently slept with a male who was dx with trich. Pt wants to be checked for trich and prescribed medication.

## 2016-05-20 NOTE — ED Provider Notes (Signed)
History  By signing my name below, I, Earmon Phoenix, attest that this documentation has been prepared under the direction and in the presence of Madline Oesterling, PA-C. Electronically Signed: Earmon Phoenix, ED Scribe. 05/20/2016. 3:39 PM.  Chief Complaint  Patient presents with  . Exposure to STD   The history is provided by the patient and medical records. No language interpreter was used.    HPI Comments:  Vernon Floyd is an obese 45 y.o. male who presents to the Emergency Department complaining of an STD exposure. Pt states his girlfriend was diagnosed with trichomonas last month and told him that he needs to be treated. He has not yet been evaluated for this because he has not experienced any symptoms. He denies dysuria, abdominal pain, nausea, vomiting, fever, chills, penile discharge, scrotal or penile swelling or pain.  Past Medical History  Diagnosis Date  . Obesity   . Hypertension    Past Surgical History  Procedure Laterality Date  . Knee arthroscopy     History reviewed. No pertinent family history. Social History  Substance Use Topics  . Smoking status: Never Smoker   . Smokeless tobacco: Never Used  . Alcohol Use: Yes    Review of Systems  Constitutional: Negative for fever and chills.  Gastrointestinal: Negative for nausea, vomiting and abdominal pain.  Genitourinary: Negative for dysuria, discharge, penile swelling, scrotal swelling, penile pain and testicular pain.    Allergies  Review of patient's allergies indicates no known allergies.  Home Medications   Prior to Admission medications   Medication Sig Start Date End Date Taking? Authorizing Provider  cyclobenzaprine (FLEXERIL) 10 MG tablet Take 1 tablet (10 mg total) by mouth every 8 (eight) hours as needed for muscle spasms. 12/10/14   Kathrynn Speed, PA-C  HYDROcodone-acetaminophen (NORCO/VICODIN) 5-325 MG per tablet Take 1-2 tablets by mouth every 4 (four) hours as needed. 12/10/14   Robyn M  Hess, PA-C  naproxen (NAPROSYN) 500 MG tablet Take 1 tablet (500 mg total) by mouth 2 (two) times daily. 12/10/14   Kathrynn Speed, PA-C   Triage Vitals: BP 158/106 mmHg  Pulse 85  Temp(Src) 98.2 F (36.8 C) (Oral)  Resp 18  Ht 5\' 11"  (1.803 m)  Wt 294 lb (133.358 kg)  BMI 41.02 kg/m2  SpO2 99% Physical Exam  Constitutional: He is oriented to person, place, and time. He appears well-developed and well-nourished.  HENT:  Head: Normocephalic and atraumatic.  Eyes: EOM are normal.  Neck: Normal range of motion.  Cardiovascular: Normal rate.   Pulmonary/Chest: Effort normal.  Musculoskeletal: Normal range of motion.  Neurological: He is alert and oriented to person, place, and time.  Skin: Skin is warm and dry.  Psychiatric: He has a normal mood and affect. His behavior is normal.  Nursing note and vitals reviewed.   ED Course  Procedures (including critical care time) DIAGNOSTIC STUDIES: Oxygen Saturation is 99% on RA, normal by my interpretation.   COORDINATION OF CARE: 3:38 PM- Will treat for GC/chlamydia and trichomonas. Pt verbalizes understanding and agrees to plan.  Medications - No data to display   MDM   Final diagnoses:  Exposure to STD    Patient emergency department seeking treatment for possible STD. He is asymptomatic. Will cover for possible exposure with Rocephin 250 mg IM, Zithromax 1 g by mouth, Flagyl 2 g by mouth. No intercourse for one week. Follow-up with health department or primary care doctor. Patient agrees.  Filed Vitals:   05/20/16 1518  BP:  158/106  Pulse: 85  Temp: 98.2 F (36.8 C)  TempSrc: Oral  Resp: 18  Height:  (1.803 m)  Weight: 133.358 kg  SpO2: 99%   I personally performed the services described in this documentation, which was scribed in my presence. The recorded information has been reviewed and is accurate.   Jaynie Crumble, PA-C 05/20/16 1556  Courteney Randall An, MD 05/20/16 [phone]

## 2016-09-29 ENCOUNTER — Encounter (HOSPITAL_COMMUNITY): Payer: Self-pay | Admitting: Emergency Medicine

## 2016-09-29 ENCOUNTER — Ambulatory Visit (HOSPITAL_COMMUNITY)
Admission: EM | Admit: 2016-09-29 | Discharge: 2016-09-29 | Disposition: A | Discharge status: Home / Self Care | Payer: Self-pay | Attending: Family Medicine | Admitting: Family Medicine

## 2016-09-29 DIAGNOSIS — Z202 Contact with and (suspected) exposure to infections with a predominantly sexual mode of transmission: Secondary | ICD-10-CM | POA: Insufficient documentation

## 2016-09-29 DIAGNOSIS — I10 Essential (primary) hypertension: Secondary | ICD-10-CM | POA: Insufficient documentation

## 2016-09-29 DIAGNOSIS — E669 Obesity, unspecified: Secondary | ICD-10-CM | POA: Insufficient documentation

## 2016-09-29 DIAGNOSIS — Z7982 Long term (current) use of aspirin: Secondary | ICD-10-CM | POA: Insufficient documentation

## 2016-09-29 DIAGNOSIS — R079 Chest pain, unspecified: Secondary | ICD-10-CM | POA: Insufficient documentation

## 2016-09-29 DIAGNOSIS — R0789 Other chest pain: Secondary | ICD-10-CM

## 2016-09-29 MED ORDER — METRONIDAZOLE 500 MG PO TABS: 500.0000 mg | ORAL_TABLET | Freq: Two times a day (BID) | ORAL | 0 refills | Status: DC

## 2016-09-29 NOTE — ED Triage Notes (Addendum)
[facility] a [facility] to [facility]. He reported that his girl friend advised that she had been treated for trichomonas. The patient reported no symptoms at this time.   The patient also complained of chest pain x 2 weeks. The patient stated that the pain starts in his left chest shoulder area and radiates into his right chest and shoulder and sometimes effects his legs. The patient stated that his pain is a 6/10 right now. The patient described the pain as a pressure. The patient stated that the pain is worse with arm movement and inspiration. The patient denied any past cardiac hx. The patient did report a recent hx of anxiety.

## 2016-09-29 NOTE — ED Notes (Signed)
Dirty urine collected. 

## 2016-09-29 NOTE — ED Provider Notes (Signed)
[facility]    CSN: [id number] Arrival date & time: 09/29/16  1258     History   Chief Complaint Chief Complaint  Patient presents with  . Exposure to STD  . Chest Pain    HPI Vernon Floyd is a 45 y.o. male.   The history is provided by the patient.  Exposure to STD  This is a new problem. The current episode started more than 2 days ago (told by girl that she had trich. pt with no sx.). The problem has not changed since onset.Associated symptoms include chest pain. Pertinent negatives include no abdominal pain and no shortness of breath.  Chest Pain  Pain location:  R lateral chest and L lateral chest Pain quality: sharp   Pain radiates to:  Does not radiate Pain severity:  Mild Onset quality:  Gradual Duration:  2 weeks Progression:  Unchanged Chronicity:  New Context: stress   Relieved by:  None tried Worsened by:  Nothing Ineffective treatments:  None tried Associated symptoms: anxiety   Associated symptoms: no abdominal pain, no fatigue, no fever, no nausea, no palpitations, no shortness of breath, no vomiting and no weakness     Past Medical History:  Diagnosis Date  . Hypertension   . Obesity     There are no active problems to display for this patient.   Past Surgical History:  Procedure Laterality Date  . KNEE ARTHROSCOPY         Home Medications    Prior to Admission medications   Medication Sig Start Date End Date Taking? Authorizing Provider  aspirin 81 MG chewable tablet Chew by mouth daily.   Yes Historical Provider, MD  cyclobenzaprine (FLEXERIL) 10 MG tablet Take 1 tablet (10 mg total) by mouth every 8 (eight) hours as needed for muscle spasms. 12/10/14   Kathrynn Speedobyn M Hess, PA-C  HYDROcodone-acetaminophen (NORCO/VICODIN) 5-325 MG per tablet Take 1-2 tablets by mouth every 4 (four) hours as needed. 12/10/14   Robyn M Hess, PA-C  naproxen (NAPROSYN) 500 MG tablet Take 1 tablet (500 mg total) by mouth 2 (two) times daily. 12/10/14    Kathrynn Speedobyn M Hess, PA-C    Family History History reviewed. No pertinent family history.  Social History Social History  Substance Use Topics  . Smoking status: Never Smoker  . Smokeless tobacco: Never Used  . Alcohol use Yes     Allergies   Patient has no known allergies.   Review of Systems Review of Systems  Constitutional: Negative.  Negative for fatigue and fever.  Respiratory: Negative.  Negative for shortness of breath.   Cardiovascular: Positive for chest pain. Negative for palpitations and leg swelling.  Gastrointestinal: Negative.  Negative for abdominal pain, nausea and vomiting.  Neurological: Negative for weakness.     Physical Exam Triage Vital Signs ED Triage Vitals  Enc Vitals Group     BP 09/29/16 1338 128/81     Pulse Rate 09/29/16 1338 90     Resp 09/29/16 1338 20     Temp 09/29/16 1338 98.2 F (36.8 C)     Temp Source 09/29/16 1338 Oral     SpO2 09/29/16 1338 99 %     Weight --      Height --      Head Circumference --      Peak Flow --      Pain Score 09/29/16 1347 6     Pain Loc --      Pain Edu? --  Excl. in GC? --    No data found.   Updated Vital Signs BP 128/81 (BP Location: Right Arm)   Pulse 90   Temp 98.2 F (36.8 C) (Oral)   Resp 20   SpO2 99%   Visual Acuity Right Eye Distance:   Left Eye Distance:   Bilateral Distance:    Right Eye Near:   Left Eye Near:    Bilateral Near:     Physical Exam  Constitutional: He is oriented to person, place, and time. He appears well-developed and well-nourished. No distress.  Eyes: Conjunctivae and EOM are normal. Pupils are equal, round, and reactive to light.  Neck: Normal range of motion. Neck supple.  Cardiovascular: Normal rate, regular rhythm, normal heart sounds and intact distal pulses.   Pulmonary/Chest: Effort normal and breath sounds normal. He exhibits tenderness.  Genitourinary: Testes normal and penis normal. Circumcised. No penile tenderness. No discharge found.    Lymphadenopathy:    He has no cervical adenopathy.  Neurological: He is alert and oriented to person, place, and time.  Skin: Skin is warm and dry.  Nursing note and vitals reviewed.    UC Treatments / Results  Labs (all labs ordered are listed, but only abnormal results are displayed) Labs Reviewed  URINE CYTOLOGY ANCILLARY ONLY    EKG ED ECG REPORT   Date: 09/29/2016  Rate: 84  Rhythm: normal sinus rhythm  QRS Axis: normal  Intervals: normal  ST/T Wave abnormalities: normal  Conduction Disutrbances:none  Narrative Interpretation:   Old EKG Reviewed: none available  I have personally reviewed the EKG tracing and agree with the computerized printout as noted.   Radiology No results found.  Procedures Procedures (including critical care time)  Medications Ordered in UC Medications - No data to display   Initial Impression / Assessment and Plan / UC Course  I have reviewed the triage vital signs and the nursing notes.  Pertinent labs & imaging results that were available during my care of the patient were reviewed by me and considered in my medical decision making (see chart for details).  Clinical Course       Final Clinical Impressions(s) / UC Diagnoses   Final diagnoses:  None    New Prescriptions New Prescriptions   No medications on file     Linna HoffJames D Madelaine Whipple, MD 09/29/16 1432

## 2016-10-02 LAB — URINE CYTOLOGY ANCILLARY ONLY
Chlamydia: NEGATIVE
Neisseria Gonorrhea: NEGATIVE
Trichomonas: NEGATIVE

## 2016-10-31 ENCOUNTER — Emergency Department (HOSPITAL_COMMUNITY)
Admission: EM | Admit: 2016-10-31 | Discharge: 2016-10-31 | Disposition: A | Discharge status: Home / Self Care | Payer: Self-pay | Attending: Emergency Medicine | Admitting: Emergency Medicine

## 2016-10-31 DIAGNOSIS — I10 Essential (primary) hypertension: Secondary | ICD-10-CM | POA: Insufficient documentation

## 2016-10-31 DIAGNOSIS — M5441 Lumbago with sciatica, right side: Secondary | ICD-10-CM | POA: Insufficient documentation

## 2016-10-31 DIAGNOSIS — Z79899 Other long term (current) drug therapy: Secondary | ICD-10-CM | POA: Insufficient documentation

## 2016-10-31 DIAGNOSIS — Z7982 Long term (current) use of aspirin: Secondary | ICD-10-CM | POA: Insufficient documentation

## 2016-10-31 MED ORDER — METHOCARBAMOL 500 MG PO TABS: 500.0000 mg | ORAL_TABLET | Freq: Two times a day (BID) | ORAL | 0 refills | Status: DC

## 2016-10-31 MED ORDER — NAPROXEN 500 MG PO TABS: 500.0000 mg | ORAL_TABLET | Freq: Two times a day (BID) | ORAL | 0 refills | Status: DC

## 2016-10-31 NOTE — ED Notes (Signed)
Patient currently in x ray.  Called and asked x ray tech to bring patient to fast track 6 when finished.

## 2016-10-31 NOTE — ED Triage Notes (Signed)
Onset 3 weeks lower back pain, radiating down right leg.  No bowel/bladder complications.

## 2016-10-31 NOTE — ED Provider Notes (Signed)
[facility]   CSN: [id number] Arrival date & time: 10/31/16  1403  By signing my name below, I, Rosario AdieWilliam Andrew Hiatt, attest that this documentation has been prepared under the direction and in the presence of Felicie Mornavid Everhett Bozard, NP. Electronically Signed: Rosario AdieWilliam Andrew Hiatt, ED Scribe. 10/31/16. 4:24 PM.  History   Chief Complaint Chief Complaint  Patient presents with  . Back Pain   The history is provided by the patient. No language interpreter was used.  Back Pain   This is a new problem. The current episode started more than 1 week ago. The problem occurs constantly. The pain is associated with no known injury. The pain is present in the lumbar spine. The pain radiates to the right thigh and right foot. The pain is at a severity of 10/10. The pain is severe. The symptoms are aggravated by bending and twisting. The pain is the same all the time. Pertinent negatives include no fever, no numbness, no bowel incontinence, no perianal numbness, no bladder incontinence, no paresthesias and no weakness. He has tried muscle relaxants (Hydrocodone) for the symptoms. The treatment provided no relief. Risk factors include obesity.    HPI Comments: Vernon Floyd is a 46 y.o. male with a PMHx of HTN and obesity, who presents to the Emergency Department complaining of unchanged, right-sided, lower back pain onset approximately 3 weeks ago. He notes radiation of his pain into his right buttock, hip, and down into his right leg. He states that he has a h/o similar back pain, however, his pain today is the worst that it has ever been. No recent trauma, injury, abnormal twisting, or heavy lifting to precipitate his pain. He has been taking Hydrocodone from a prior surgery and muscle relaxants at home without relief of his pain. Pt's pain is exacerbated with generalized movements. He denies nausea, vomiting, fever, chills, bowel/bladder incontinence, saddle anaesthesia/paraesthesias, focal  numbness/weakness, nocturnal hyperhidrosis, or any other associated symptoms.   Past Medical History:  Diagnosis Date  . Hypertension   . Obesity    Patient Active Problem List   Diagnosis Date Noted  . Exposure to STD 09/29/2016   Past Surgical History:  Procedure Laterality Date  . KNEE ARTHROSCOPY      Home Medications    Prior to Admission medications   Medication Sig Start Date End Date Taking? Authorizing Provider  aspirin 81 MG chewable tablet Chew by mouth daily.    Historical Provider, MD  cyclobenzaprine (FLEXERIL) 10 MG tablet Take 1 tablet (10 mg total) by mouth every 8 (eight) hours as needed for muscle spasms. 12/10/14   Kathrynn Speedobyn M Hess, PA-C  HYDROcodone-acetaminophen (NORCO/VICODIN) 5-325 MG per tablet Take 1-2 tablets by mouth every 4 (four) hours as needed. 12/10/14   Robyn M Hess, PA-C  metroNIDAZOLE (FLAGYL) 500 MG tablet Take 1 tablet (500 mg total) by mouth 2 (two) times daily. 09/29/16   Linna HoffJames D Kindl, MD  naproxen (NAPROSYN) 500 MG tablet Take 1 tablet (500 mg total) by mouth 2 (two) times daily. 12/10/14   Kathrynn Speedobyn M Hess, PA-C   Family History No family history on file.  Social History Social History  Substance Use Topics  . Smoking status: Never Smoker  . Smokeless tobacco: Never Used  . Alcohol use Yes   Allergies   Patient has no known allergies.  Review of Systems Review of Systems  Constitutional: Negative for chills, diaphoresis and fever.  Gastrointestinal: Negative for bowel incontinence, nausea and vomiting.  Genitourinary: Negative for bladder  incontinence.  Musculoskeletal: Positive for back pain and myalgias.  Neurological: Negative for weakness, numbness and paresthesias.       Negative for bowel/bladder incontinence. Negative for saddle anaesthesia/paraesthesias.   All other systems reviewed and are negative.  Physical Exam Updated Vital Signs BP 143/88   Pulse 84   Temp 99 F (37.2 C) (Oral)   Resp 20   Ht 5\' 11"  (1.803 m)   Wt  289 lb (131.1 kg)   SpO2 94%   BMI 40.31 kg/m   Physical Exam  Constitutional: He appears well-developed and well-nourished. No distress.  HENT:  Head: Normocephalic and atraumatic.  Eyes: Conjunctivae are normal.  Neck: Normal range of motion.  Cardiovascular: Normal rate, regular rhythm and normal heart sounds.   No murmur heard. Pulmonary/Chest: Effort normal and breath sounds normal. No respiratory distress. He has no wheezes. He has no rales.  Abdominal: He exhibits no distension.  Musculoskeletal: Normal range of motion.  Right buttock pain and tenderness that extends into the hip and thigh.   Neurological: He is alert.  Skin: No pallor.  Psychiatric: He has a normal mood and affect. His behavior is normal.  Nursing note and vitals reviewed.  ED Treatments / Results  DIAGNOSTIC STUDIES: Oxygen Saturation is 94% on RA, adequate by my interpretation.   COORDINATION OF CARE: 4:10 PM-Discussed next steps with pt. Pt verbalized understanding and is agreeable with the plan.   Labs (all labs ordered are listed, but only abnormal results are displayed) Labs Reviewed - No data to display  EKG  EKG Interpretation None      Radiology No results found.  Procedures Procedures   Medications Ordered in ED Medications - No data to display  Initial Impression / Assessment and Plan / ED Course  I have reviewed the triage vital signs and the nursing notes.  Pertinent labs & imaging results that were available during my care of the patient were reviewed by me and considered in my medical decision making (see chart for details).  Clinical Course    Patient is a 45yM with a hx of obesity and HTN, who presents to the ED with back pain. No neurological deficits appreciated. Patient is ambulatory. No warning symptoms of back pain including: fecal incontinence, urinary retention or overflow incontinence, night sweats, waking from sleep with back pain, unexplained fevers or weight  loss, h/o cancer, IVDU, recent trauma. No concern for cauda equina, epidural abscess, or other serious cause of back pain. Conservative measures such as rest, ice/heat and pain medicine indicated with PCP follow-up if no improvement with conservative management.   Final Clinical Impressions(s) / ED Diagnoses   Final diagnoses:  Acute right-sided low back pain with right-sided sciatica   New Prescriptions Discharge Medication List as of 10/31/2016  4:17 PM    START taking these medications   Details  methocarbamol (ROBAXIN) 500 MG tablet Take 1 tablet (500 mg total) by mouth 2 (two) times daily., Starting Tue 10/31/2016, Print       I personally performed the services described in this documentation, which was scribed in my presence. The recorded information has been reviewed and is accurate.     Felicie Morn, NP 10/31/16 1829    Geoffery Lyons, MD 10/31/16 2028

## 2016-10-31 NOTE — ED Notes (Addendum)
Patient ambulates independently without difficulty.  He states today's complaint feels the same as when he was diagnosed with sciatica in 2016.

## 2018-03-03 ENCOUNTER — Encounter (HOSPITAL_COMMUNITY): Payer: Self-pay | Admitting: Emergency Medicine

## 2018-03-03 ENCOUNTER — Emergency Department (HOSPITAL_COMMUNITY): Payer: Self-pay

## 2018-03-03 ENCOUNTER — Emergency Department (HOSPITAL_COMMUNITY)
Admission: EM | Admit: 2018-03-03 | Discharge: 2018-03-04 | Disposition: A | Discharge status: Home / Self Care | Payer: Self-pay | Attending: Emergency Medicine | Admitting: Emergency Medicine

## 2018-03-03 DIAGNOSIS — E119 Type 2 diabetes mellitus without complications: Secondary | ICD-10-CM | POA: Insufficient documentation

## 2018-03-03 DIAGNOSIS — I10 Essential (primary) hypertension: Secondary | ICD-10-CM | POA: Insufficient documentation

## 2018-03-03 DIAGNOSIS — R0789 Other chest pain: Secondary | ICD-10-CM | POA: Insufficient documentation

## 2018-03-03 DIAGNOSIS — Z7982 Long term (current) use of aspirin: Secondary | ICD-10-CM | POA: Insufficient documentation

## 2018-03-03 DIAGNOSIS — Z79899 Other long term (current) drug therapy: Secondary | ICD-10-CM | POA: Insufficient documentation

## 2018-03-03 HISTORY — DX: Type 2 diabetes mellitus without complications: E11.9

## 2018-03-03 LAB — CBC
HCT: 45.6 % (ref 39.0–52.0)
Hemoglobin: 15.4 g/dL (ref 13.0–17.0)
MCH: 30.9 pg (ref 26.0–34.0)
MCHC: 33.8 g/dL (ref 30.0–36.0)
MCV: 91.6 fL (ref 78.0–100.0)
PLATELETS: 303 10*3/uL (ref 150–400)
RBC: 4.98 MIL/uL (ref 4.22–5.81)
RDW: 14.2 % (ref 11.5–15.5)
WBC: 10.1 10*3/uL (ref 4.0–10.5)

## 2018-03-03 LAB — BASIC METABOLIC PANEL
Anion gap: 8 (ref 5–15)
BUN: 14 mg/dL (ref 6–20)
CO2: 26 mmol/L (ref 22–32)
CREATININE: 1.04 mg/dL (ref 0.61–1.24)
Calcium: 9.1 mg/dL (ref 8.9–10.3)
Chloride: 101 mmol/L (ref 101–111)
GFR calc Af Amer: >60 mL/min (ref >60)
Glucose, Bld: 124 mg/dL — ABNORMAL HIGH (ref 65–99)
Potassium: 4.3 mmol/L (ref 3.5–5.1)
Sodium: 135 mmol/L (ref 135–145)

## 2018-03-03 LAB — I-STAT TROPONIN, ED: Troponin i, poc: 0 ng/mL (ref 0.00–0.08)

## 2018-03-03 NOTE — ED Triage Notes (Signed)
Patient presents to ED after sitting at a restaurant and experiencing a left chest pressure/pain/palpitation.  States he went home and began experiencing left shoulder and left neck pain.  Denies SOB, denies n/v, denies light-headedness/dizziness.

## 2018-03-04 LAB — HEPATIC FUNCTION PANEL
ALT: 49 U/L (ref 17–63)
AST: 35 U/L (ref 15–41)
Albumin: 3.7 g/dL (ref 3.5–5.0)
Alkaline Phosphatase: 73 U/L (ref 38–126)
Bilirubin, Direct: 0.1 mg/dL (ref 0.1–0.5)
Indirect Bilirubin: 0.4 mg/dL (ref 0.3–0.9)
TOTAL PROTEIN: 8.4 g/dL — AB (ref 6.5–8.1)
Total Bilirubin: 0.5 mg/dL (ref 0.3–1.2)

## 2018-03-04 LAB — TROPONIN I

## 2018-03-04 LAB — LIPASE, BLOOD: Lipase: 31 U/L (ref 11–51)

## 2018-03-04 LAB — D-DIMER, QUANTITATIVE (NOT AT ARMC)

## 2018-03-04 MED ORDER — KETOROLAC TROMETHAMINE 30 MG/ML IJ SOLN
30.0000 mg | Freq: Once | INTRAMUSCULAR | Status: AC
Start: 2018-03-04 — End: 2018-03-04
  Administered 2018-03-04: 30 mg via INTRAVENOUS
  Filled 2018-03-04: qty 1

## 2018-03-04 NOTE — Discharge Instructions (Addendum)
There is no evidence of heart attack or blood clot in the lung. Followup with your doctor for a stress. You should also have your sleep study as scheduled. Return to the ED if your chest pain becomes exertional, associated with shortness of breath, nausea, vomiting, sweating, or any other concerns.

## 2018-03-04 NOTE — ED Provider Notes (Signed)
[facility] EMERGENCY DEPARTMENT Provider Note   CSN: [id number] Arrival date & time: 03/03/18  2104     History   Chief Complaint Chief Complaint  Patient presents with  . Chest Pain  . Palpitations    HPI Vernon Floyd is a 47 y.o. male.  Patient with history of morbid obesity, hypertension, prediabetes and suspected sleep apnea presenting with episode of palpitations and chest discomfort that onset while he was at a restaurant last evening.  Reports that happened after he ate he felt a "funny feeling" in the center of his chest with sensation of heart skipping beats.  This lasted for several minutes before resolving on its own.  Upon returning home, patient developed pain to his left neck and upper back and shoulder.  This is been constant since approximately 9 PM last night.  He denies any falls or trauma.  He did not take anything for this pain.  The pain is not worse with palpation or movement.  The pain does not radiate down his left arm.  There is no abdominal pain, leg pain or leg swelling.  No fever or cough.  Denies any headache.  Denies any focal weakness, numbness or tingling.  Denies any visual change.  The history is provided by the patient.  Chest Pain   Associated symptoms include palpitations. Pertinent negatives include no abdominal pain, no back pain, no dizziness, no fever, no headaches, no nausea, no shortness of breath, no vomiting and no weakness.  Palpitations   Associated symptoms include chest pain. Pertinent negatives include no fever, no abdominal pain, no nausea, no vomiting, no headaches, no back pain, no dizziness, no weakness and no shortness of breath.    Past Medical History:  Diagnosis Date  . Diabetes mellitus without complication (HCC)   . Hypertension   . Obesity     Patient Active Problem List   Diagnosis Date Noted  . Exposure to STD 09/29/2016    Past Surgical History:  Procedure Laterality Date  . KNEE ARTHROSCOPY           Home Medications    Prior to Admission medications   Medication Sig Start Date End Date Taking? Authorizing Provider  aspirin 81 MG chewable tablet Chew by mouth daily.    [provider]  cyclobenzaprine (FLEXERIL) 10 MG tablet Take 1 tablet (10 mg total) by mouth every 8 (eight) hours as needed for muscle spasms. 12/10/14   Hess, Nada Boozer, PA-C  HYDROcodone-acetaminophen (NORCO/VICODIN) 5-325 MG per tablet Take 1-2 tablets by mouth every 4 (four) hours as needed. 12/10/14   Hess, Nada Boozer, PA-C  methocarbamol (ROBAXIN) 500 MG tablet Take 1 tablet (500 mg total) by mouth 2 (two) times daily. 10/31/16   Felicie Morn, NP  metroNIDAZOLE (FLAGYL) 500 MG tablet Take 1 tablet (500 mg total) by mouth 2 (two) times daily. 09/29/16   Linna Hoff, MD  naproxen (NAPROSYN) 500 MG tablet Take 1 tablet (500 mg total) by mouth 2 (two) times daily. 10/31/16   Felicie Morn, NP    Family History History reviewed. No pertinent family history.  Social History Social History   Tobacco Use  . Smoking status: Never Smoker  . Smokeless tobacco: Never Used  Substance Use Topics  . Alcohol use: Yes    Comment: "not in a while"  . Drug use: No     Allergies   Patient has no known allergies.   Review of Systems Review of Systems  Constitutional: Negative  for activity change, appetite change and fever.  HENT: Negative for congestion and rhinorrhea.   Eyes: Negative for redness and visual disturbance.  Respiratory: Positive for chest tightness. Negative for shortness of breath.   Cardiovascular: Positive for chest pain and palpitations.  Gastrointestinal: Negative for abdominal pain, nausea and vomiting.  Musculoskeletal: Positive for myalgias and neck pain. Negative for arthralgias and back pain.  Neurological: Negative for dizziness, weakness and headaches.   all other systems are negative except as noted in the HPI and PMH.     Physical Exam Updated Vital Signs BP (!) 134/91    Pulse 90   Temp 98.3 F (36.8 C) (Oral)   Resp 12   SpO2 100%   Physical Exam  Constitutional: He is oriented to person, place, and time. He appears well-developed and well-nourished. No distress.  HENT:  Head: Normocephalic and atraumatic.  Mouth/Throat: Oropharynx is clear and moist. No oropharyngeal exudate.  Eyes: Pupils are equal, round, and reactive to light. Conjunctivae and EOM are normal.  Neck: Normal range of motion. Neck supple.  Patient indicates left paraspinal cervical area is location of pain as well as upper trapezius but this is not painful to palpation  Cardiovascular: Normal rate, regular rhythm, normal heart sounds and intact distal pulses.  No murmur heard. Pulmonary/Chest: Effort normal and breath sounds normal. No respiratory distress.  Abdominal: Soft. There is no tenderness. There is no rebound and no guarding.  Musculoskeletal: Normal range of motion. He exhibits no edema or tenderness.  Equal radial pulses and grip strengths  Neurological: He is alert and oriented to person, place, and time. No cranial nerve deficit. He exhibits normal muscle tone. Coordination normal.  No ataxia on finger to nose bilaterally. No pronator drift. 5/5 strength throughout. CN 2-12 intact.Equal grip strength. Sensation intact.   Skin: Skin is warm.  Psychiatric: He has a normal mood and affect. His behavior is normal.  Nursing note and vitals reviewed.    ED Treatments / Results  Labs (all labs ordered are listed, but only abnormal results are displayed) Labs Reviewed  BASIC METABOLIC PANEL - Abnormal; Notable for the following components:      Result Value   Glucose, Bld 124 (*)    All other components within normal limits  HEPATIC FUNCTION PANEL - Abnormal; Notable for the following components:   Total Protein 8.4 (*)    All other components within normal limits  CBC  LIPASE, BLOOD  D-DIMER, QUANTITATIVE (NOT AT [facility])  TROPONIN I  I-STAT TROPONIN, ED     EKG EKG Interpretation  Date/Time:  Sunday Mar 03 2018 21:10:10 EDT Ventricular Rate:  81 PR Interval:  166 QRS Duration: 94 QT Interval:  368 QTC Calculation: 427 R Axis:   30 Text Interpretation:  Normal sinus rhythm Normal ECG Confirmed by Rolland Porter ([id number]) on 03/04/2018 1:03:54 AM   Radiology Dg Chest 2 View  Result Date: 03/03/2018 CLINICAL DATA:  47 year old male with chest pain. EXAM: CHEST - 2 VIEW COMPARISON:  None. FINDINGS: The heart size and mediastinal contours are within normal limits. Both lungs are clear. The visualized skeletal structures are unremarkable. IMPRESSION: No active cardiopulmonary disease. Electronically Signed   By: Elgie Collard M.D.   On: 03/03/2018 22:01    Procedures Procedures (including critical care time)  Medications Ordered in ED Medications  ketorolac (TORADOL) 30 MG/ML injection 30 mg (has no administration in time range)     Initial Impression / Assessment and Plan / ED Course  I have reviewed the triage vital signs and the nursing notes.  Pertinent labs & imaging results that were available during my care of the patient were reviewed by me and considered in my medical decision making (see chart for details).    Patient with episode of chest discomfort associated palpitations which is since resolved.  EKG is normal sinus rhythm.  Low suspicion for ACS.  Patient with ongoing left neck and trapezius pain since about 9 PM.  No tenderness to palpation.  No weakness or numbness.  Chest x-ray is negative.  Troponin is negative and d-dimer is negative.  Blood pressure and blood pressures.  Low suspicion for ACS, PE, aortic dissection.  Troponin negative x2.  Neck and trapezius pain has resolved with Toradol.  Discussed with patient low suspicion for ACS.  Advised follow-up with PCP for stress test.  He is also supposed to have a sleep study.  Advised to return to the ED if chest pain becomes exertional, associated shortness of  breath, nausea, vomiting, diaphoresis or any other concerns.  Final Clinical Impressions(s) / ED Diagnoses   Final diagnoses:  Atypical chest pain    ED Discharge Orders    None       Marieelena Bartko, Jeannett Senior, MD 03/04/18 [phone]

## 2018-03-04 NOTE — ED Notes (Signed)
ED Provider at bedside. 

## 2018-10-13 ENCOUNTER — Encounter (HOSPITAL_BASED_OUTPATIENT_CLINIC_OR_DEPARTMENT_OTHER): Payer: Self-pay | Admitting: Emergency Medicine

## 2018-10-13 ENCOUNTER — Emergency Department (HOSPITAL_BASED_OUTPATIENT_CLINIC_OR_DEPARTMENT_OTHER)
Admission: EM | Admit: 2018-10-13 | Discharge: 2018-10-13 | Disposition: A | Discharge status: Home / Self Care | Payer: Self-pay | Attending: Emergency Medicine | Admitting: Emergency Medicine

## 2018-10-13 ENCOUNTER — Other Ambulatory Visit: Payer: Self-pay

## 2018-10-13 ENCOUNTER — Emergency Department (HOSPITAL_BASED_OUTPATIENT_CLINIC_OR_DEPARTMENT_OTHER): Payer: Self-pay

## 2018-10-13 DIAGNOSIS — I10 Essential (primary) hypertension: Secondary | ICD-10-CM | POA: Insufficient documentation

## 2018-10-13 DIAGNOSIS — Z79899 Other long term (current) drug therapy: Secondary | ICD-10-CM | POA: Insufficient documentation

## 2018-10-13 DIAGNOSIS — R112 Nausea with vomiting, unspecified: Secondary | ICD-10-CM | POA: Insufficient documentation

## 2018-10-13 DIAGNOSIS — R197 Diarrhea, unspecified: Secondary | ICD-10-CM | POA: Insufficient documentation

## 2018-10-13 DIAGNOSIS — E119 Type 2 diabetes mellitus without complications: Secondary | ICD-10-CM | POA: Insufficient documentation

## 2018-10-13 DIAGNOSIS — R109 Unspecified abdominal pain: Secondary | ICD-10-CM | POA: Insufficient documentation

## 2018-10-13 LAB — CBC WITH DIFFERENTIAL/PLATELET
ABS IMMATURE GRANULOCYTES: 0.04 10*3/uL (ref 0.00–0.07)
BASOS PCT: 0 %
Basophils Absolute: 0 10*3/uL (ref 0.0–0.1)
Eosinophils Absolute: 0 10*3/uL (ref 0.0–0.5)
Eosinophils Relative: 0 %
HCT: 50.9 % (ref 39.0–52.0)
Hemoglobin: 16.4 g/dL (ref 13.0–17.0)
Immature Granulocytes: 1 %
Lymphocytes Relative: 51 %
Lymphs Abs: 2.1 10*3/uL (ref 0.7–4.0)
MCH: 29.9 pg (ref 26.0–34.0)
MCHC: 32.2 g/dL (ref 30.0–36.0)
MCV: 92.7 fL (ref 80.0–100.0)
MONO ABS: 0.2 10*3/uL (ref 0.1–1.0)
MONOS PCT: 5 %
NEUTROS ABS: 1.7 10*3/uL (ref 1.7–7.7)
Neutrophils Relative %: 43 %
PLATELETS: 290 10*3/uL (ref 150–400)
RBC: 5.49 MIL/uL (ref 4.22–5.81)
RDW: 14.6 % (ref 11.5–15.5)
WBC: 4 10*3/uL (ref 4.0–10.5)
nRBC: 0 % (ref 0.0–0.2)

## 2018-10-13 LAB — COMPREHENSIVE METABOLIC PANEL
ALK PHOS: 70 U/L (ref 38–126)
ALT: 49 U/L — AB (ref 0–44)
AST: 42 U/L — AB (ref 15–41)
Albumin: 4.1 g/dL (ref 3.5–5.0)
Anion gap: 10 (ref 5–15)
BILIRUBIN TOTAL: 0.4 mg/dL (ref 0.3–1.2)
BUN: 16 mg/dL (ref 6–20)
CALCIUM: 8.9 mg/dL (ref 8.9–10.3)
CO2: 22 mmol/L (ref 22–32)
CREATININE: 1.04 mg/dL (ref 0.61–1.24)
Chloride: 108 mmol/L (ref 98–111)
GFR calc non Af Amer: >60 mL/min (ref >60)
Glucose, Bld: 130 mg/dL — ABNORMAL HIGH (ref 70–99)
Potassium: 3.6 mmol/L (ref 3.5–5.1)
Sodium: 140 mmol/L (ref 135–145)
Total Protein: 8.8 g/dL — ABNORMAL HIGH (ref 6.5–8.1)

## 2018-10-13 LAB — URINALYSIS, ROUTINE W REFLEX MICROSCOPIC
BILIRUBIN URINE: NEGATIVE
GLUCOSE, UA: NEGATIVE mg/dL
HGB URINE DIPSTICK: NEGATIVE
Ketones, ur: NEGATIVE mg/dL
Leukocytes, UA: NEGATIVE
Nitrite: NEGATIVE
PROTEIN: NEGATIVE mg/dL
Specific Gravity, Urine: 1.01 (ref 1.005–1.030)
pH: 6 (ref 5.0–8.0)

## 2018-10-13 LAB — LIPASE, BLOOD: LIPASE: 46 U/L (ref 11–51)

## 2018-10-13 MED ORDER — DICYCLOMINE HCL 20 MG PO TABS: 20.0000 mg | ORAL_TABLET | Freq: Two times a day (BID) | ORAL | 0 refills | Status: DC

## 2018-10-13 MED ORDER — IOPAMIDOL (ISOVUE-300) INJECTION 61%
100.0000 mL | Freq: Once | INTRAVENOUS | Status: AC | PRN
  Administered 2018-10-13: 100 mL via INTRAVENOUS

## 2018-10-13 MED ORDER — ONDANSETRON 4 MG PO TBDP: 4.0000 mg | ORAL_TABLET | Freq: Three times a day (TID) | ORAL | 0 refills | Status: DC | PRN

## 2018-10-13 MED ORDER — SODIUM CHLORIDE 0.9 % IV BOLUS
1000.0000 mL | Freq: Once | INTRAVENOUS | Status: AC
  Administered 2018-10-13: 1000 mL via INTRAVENOUS

## 2018-10-13 MED ORDER — ONDANSETRON HCL 4 MG/2ML IJ SOLN
4.0000 mg | Freq: Once | INTRAMUSCULAR | Status: AC
  Administered 2018-10-13: 4 mg via INTRAVENOUS
  Filled 2018-10-13: qty 2

## 2018-10-13 MED ORDER — DICYCLOMINE HCL 10 MG/ML IM SOLN
20.0000 mg | Freq: Once | INTRAMUSCULAR | Status: AC
  Administered 2018-10-13: 20 mg via INTRAMUSCULAR
  Filled 2018-10-13: qty 2

## 2018-10-13 NOTE — ED Provider Notes (Signed)
[facility] EMERGENCY DEPARTMENT Provider Note   CSN: [id number] Arrival date & time: 10/13/18  1405   History   Chief Complaint Chief Complaint  Patient presents with  . Emesis    HPI Vernon Floyd is a 47 y.o. male with history significant for morbid obesity, diabetes, hypertension who presents for evaluation of nausea and vomiting.  Patient states he woke up this morning and ate seafood.  Patient states the seafood was from 4 days ago.  Per patient's wife this food has sat on the counter and been reheated over 4 times.  Patient states he had non-bloody, nonbilious emesis as well as nonbloody diarrhea approximately 1 hour after eating this food. No one else has eaten the same food.  States he has had 5 episodes of nonbloody, nonbilious emesis PTA.  States he has had abdominal cramping in his onset of diarrhea and emesis.  Rates his pain a 7/10.  Pain does not radiate.  Has not taken anything for his symptoms.  Denies fever, chills, headache, vision changes, chest pain, shortness of breath, hematemesis, hemoptysis, melena, hematochezia, dysuria.  Denies alleviating or aggravating symptoms unless otherwise stated in the HPI.    History obtained from patient.  No interpreter was used.  HPI  Past Medical History:  Diagnosis Date  . Diabetes mellitus without complication (HCC)   . Hypertension   . Obesity     Patient Active Problem List   Diagnosis Date Noted  . Exposure to STD 09/29/2016    Past Surgical History:  Procedure Laterality Date  . KNEE ARTHROSCOPY          Home Medications    Prior to Admission medications   Medication Sig Start Date End Date Taking? Authorizing Provider  cyclobenzaprine (FLEXERIL) 10 MG tablet Take 1 tablet (10 mg total) by mouth every 8 (eight) hours as needed for muscle spasms. Patient not taking: Reported on 03/04/2018 12/10/14   Hess, Nada Boozer, PA-C  dicyclomine (BENTYL) 20 MG tablet Take 1 tablet (20 mg total) by mouth 2 (two)  times daily. 10/13/18   Nashawn Hillock A, PA-C  HYDROcodone-acetaminophen (NORCO/VICODIN) 5-325 MG per tablet Take 1-2 tablets by mouth every 4 (four) hours as needed. Patient not taking: Reported on 03/04/2018 12/10/14   Hess, Nada Boozer, PA-C  methocarbamol (ROBAXIN) 500 MG tablet Take 1 tablet (500 mg total) by mouth 2 (two) times daily. Patient not taking: Reported on 03/04/2018 10/31/16   Felicie Morn, NP  metroNIDAZOLE (FLAGYL) 500 MG tablet Take 1 tablet (500 mg total) by mouth 2 (two) times daily. Patient not taking: Reported on 03/04/2018 09/29/16   Linna Hoff, MD  naproxen (NAPROSYN) 500 MG tablet Take 1 tablet (500 mg total) by mouth 2 (two) times daily. Patient not taking: Reported on 03/04/2018 10/31/16   Felicie Morn, NP  ondansetron (ZOFRAN ODT) 4 MG disintegrating tablet Take 1 tablet (4 mg total) by mouth every 8 (eight) hours as needed for nausea or vomiting. 10/13/18   Carrington Mullenax A, PA-C  PRESCRIPTION MEDICATION Inject 1 pen into the skin once a week. Insulin  On Tuesday    [provider]  PRESCRIPTION MEDICATION Take 1 tablet by mouth daily. Cholesterol    [provider]    Family History History reviewed. No pertinent family history.  Social History Social History   Tobacco Use  . Smoking status: Never Smoker  . Smokeless tobacco: Never Used  Substance Use Topics  . Alcohol use: Yes    Comment: "not  in a while"  . Drug use: No     Allergies   Patient has no known allergies.   Review of Systems Review of Systems  Constitutional: Negative.   HENT: Negative.   Respiratory: Negative.   Cardiovascular: Negative.   Gastrointestinal: Positive for abdominal pain, diarrhea, nausea and vomiting. Negative for abdominal distention, anal bleeding, blood in stool, constipation and rectal pain.  Genitourinary: Negative.   Musculoskeletal: Negative.   Skin: Negative.   Neurological: Negative.   All other systems reviewed and are  negative.    Physical Exam Updated Vital Signs BP 134/77 (BP Location: Right Arm)   Pulse (!) 104   Temp 98.8 F (37.1 C) (Oral)   Resp 18   Ht 5\' 11"  (1.803 m)   Wt 131.1 kg   SpO2 96%   BMI 40.31 kg/m   Physical Exam Vitals signs and nursing note reviewed.  Constitutional:      General: He is not in acute distress.    Appearance: Normal appearance. He is well-developed. He is obese. He is not ill-appearing, toxic-appearing or diaphoretic.  HENT:     Head: Atraumatic.     Mouth/Throat:     Lips: Pink.     Mouth: Mucous membranes are moist.     Pharynx: Oropharynx is clear. Uvula midline.  Eyes:     Pupils: Pupils are equal, round, and reactive to light.  Neck:     Musculoskeletal: Normal range of motion and neck supple.  Cardiovascular:     Rate and Rhythm: Regular rhythm. Tachycardia present.     Pulses: Normal pulses.     Heart sounds: Normal heart sounds. Heart sounds not distant. No murmur. No friction rub. No gallop.   Pulmonary:     Effort: Pulmonary effort is normal. No tachypnea, accessory muscle usage or respiratory distress.     Breath sounds: Normal breath sounds. No stridor. No decreased breath sounds, wheezing, rhonchi or rales.  Abdominal:     General: Bowel sounds are normal. There is no distension or abdominal bruit. There are no signs of injury.     Palpations: Abdomen is soft. There is no shifting dullness, fluid wave, hepatomegaly or mass.     Tenderness: There is generalized abdominal tenderness. There is no right CVA tenderness, left CVA tenderness, guarding or rebound. Negative signs include Murphy's sign, Rovsing's sign and McBurney's sign.     Hernia: No hernia is present.  Musculoskeletal: Normal range of motion.     Right lower leg: No edema.     Left lower leg: No edema.     Comments: Moves all extremities without difficulty. Able to ambulate without ataxic gait.  Skin:    General: Skin is warm and dry.  Neurological:     Mental Status:  He is alert.  Psychiatric:        Behavior: Behavior is cooperative.      ED Treatments / Results  Labs (all labs ordered are listed, but only abnormal results are displayed) Labs Reviewed  COMPREHENSIVE METABOLIC PANEL - Abnormal; Notable for the following components:      Result Value   Glucose, Bld 130 (*)    Total Protein 8.8 (*)    AST 42 (*)    ALT 49 (*)    All other components within normal limits  CBC WITH DIFFERENTIAL/PLATELET  LIPASE, BLOOD  URINALYSIS, ROUTINE W REFLEX MICROSCOPIC    EKG None  Radiology Ct Abdomen Pelvis W Contrast  Result Date: 10/13/2018 CLINICAL DATA:  Vomiting, diarrhea. EXAM: CT ABDOMEN AND PELVIS WITH CONTRAST TECHNIQUE: Multidetector CT imaging of the abdomen and pelvis was performed using the standard protocol following bolus administration of intravenous contrast. CONTRAST:  ISOVUE-300 IOPAMIDOL (ISOVUE-300) INJECTION 61% COMPARISON:  None. FINDINGS: Lower chest: Lung bases are clear. No effusions. Heart is normal size. Hepatobiliary: Diffuse low-density throughout the liver compatible with fatty infiltration. No focal abnormality. Gallbladder unremarkable. Pancreas: No focal abnormality or ductal dilatation. Spleen: No focal abnormality.  Normal size. Adrenals/Urinary Tract: No adrenal abnormality. No focal renal abnormality. No stones or hydronephrosis. Urinary bladder is unremarkable. Stomach/Bowel: Appendix is normal. Stomach, large and small bowel grossly unremarkable. Vascular/Lymphatic: No evidence of aneurysm or adenopathy. Reproductive: No visible focal abnormality. Other: No free fluid or free air. Musculoskeletal: No acute bony abnormality. IMPRESSION: Fatty infiltration of the liver. Normal appendix. No acute findings in the abdomen or pelvis. Electronically Signed   By: Charlett Nose M.D.   On: 10/13/2018 17:18    Procedures Procedures (including critical care time)  Medications Ordered in ED Medications  sodium chloride  0.9 % bolus 1,000 mL ( Intravenous Stopped 10/13/18 1549)  ondansetron (ZOFRAN) injection 4 mg (4 mg Intravenous Given 10/13/18 1435)  dicyclomine (BENTYL) injection 20 mg (20 mg Intramuscular Given 10/13/18 1435)  iopamidol (ISOVUE-300) 61 % injection 100 mL (100 mLs Intravenous Contrast Given 10/13/18 1654)     Initial Impression / Assessment and Plan / ED Course  I have reviewed the triage vital signs and the nursing notes.  Pertinent labs & imaging results that were available during my care of the patient were reviewed by me and considered in my medical decision making (see chart for details).  47 year old male who appears otherwise well presents for evaluation of nausea and vomiting.  Symptom onset 4 hours PTA.  Patient states he ate seafood which was 48 days old and had been reheated multiple times.  Patient has had multiple episodes of nonbloody, nonbilious emesis.  Has also had episodes of nonbloody diarrhea.  States he has intermittent abdominal cramping which occurs after emesis.  Able to tolerate p.o. liquid intake since symptom onset without difficulty.  Abdomen, nontender without rebound, rigidity or guarding. No chest pain or SOB. Patient was initially tachycardic at 130 on initial evaluation, however patient's vital signs were taken immediately after an episode of emesis.  Will recheck of vital signs, urinalysis, labs and reevaluate.  1430: Urinalysis negative for infection, lipase 46, metabolic panel with mild elevation in glucose at 130, hx of diabetes, mild elevation in AST, 42, ALT 49.  No electrolyte abnormalities, CBC without leukocytosis.  No evidence of DKA on labs. Patient given fluids as well as Bentyl and Zofran.  Will reevaluate.  1600: On reevaluation, abdomen nontender without rebound, rigidity or guarding.  No episodes of emesis since Zofran administered. With decrease in abdominal cramping which she rates now a 3/10.  Patient tachycardia with improvement to 103 on  recheck of VS  1720: CT with fatty liver, no acute findings. Patient HR 89 on my re-evaluation.  Patient sleeping soundly with no pain or episodes of emesis.  1800: Patient able to tolerate p.o. intake without difficulty. Patient is nontoxic, nonseptic appearing, in no apparent distress. Labs, imaging and vitals reviewed.  Patient does not meet the SIRS or Sepsis criteria.  On repeat exam patient does not have a surgical abdomin and there are no peritoneal signs.  No indication of appendicitis, bowel obstruction, bowel perforation, cholecystitis, diverticulitis.  Likely with gastroenteritis secondary to intake  of bad food. Patient discharged home with symptomatic treatment and given strict instructions for follow-up with their primary care physician.  I have also discussed reasons to return immediately to the ER.  Patient expresses understanding and agrees with plan.    Final Clinical Impressions(s) / ED Diagnoses   Final diagnoses:  Nausea vomiting and diarrhea    ED Discharge Orders         Ordered    dicyclomine (BENTYL) 20 MG tablet  2 times daily     10/13/18 1809    ondansetron (ZOFRAN ODT) 4 MG disintegrating tablet  Every 8 hours PRN     10/13/18 1809           Brandyn Lowrey A, PA-C 10/13/18 1910    Rolan Bucco, MD 10/14/18 [phone]

## 2018-10-13 NOTE — Discharge Instructions (Signed)
You were evaluated today for nausea and vomiting after you ate seafood. Your labs, Ct scan were negative. Please follow up with your PCP for re-evaluation.

## 2018-10-13 NOTE — ED Triage Notes (Signed)
Patient states that he went out last night and drank some ETOH and states that he ate cook out last night - patient reports that he ate seafood this am and has been throwing up since

## 2019-08-04 ENCOUNTER — Encounter (HOSPITAL_BASED_OUTPATIENT_CLINIC_OR_DEPARTMENT_OTHER): Payer: Self-pay

## 2019-08-04 ENCOUNTER — Other Ambulatory Visit: Payer: Self-pay

## 2019-08-04 ENCOUNTER — Emergency Department (HOSPITAL_BASED_OUTPATIENT_CLINIC_OR_DEPARTMENT_OTHER)
Admission: EM | Admit: 2019-08-04 | Discharge: 2019-08-04 | Disposition: A | Discharge status: Home / Self Care | Payer: BLUE CROSS/BLUE SHIELD | Attending: Emergency Medicine | Admitting: Emergency Medicine

## 2019-08-04 DIAGNOSIS — Z9114 Patient's other noncompliance with medication regimen: Secondary | ICD-10-CM | POA: Diagnosis not present

## 2019-08-04 DIAGNOSIS — E1165 Type 2 diabetes mellitus with hyperglycemia: Secondary | ICD-10-CM | POA: Diagnosis not present

## 2019-08-04 DIAGNOSIS — I10 Essential (primary) hypertension: Secondary | ICD-10-CM | POA: Insufficient documentation

## 2019-08-04 DIAGNOSIS — R739 Hyperglycemia, unspecified: Secondary | ICD-10-CM

## 2019-08-04 DIAGNOSIS — Z79899 Other long term (current) drug therapy: Secondary | ICD-10-CM | POA: Diagnosis not present

## 2019-08-04 DIAGNOSIS — R350 Frequency of micturition: Secondary | ICD-10-CM | POA: Diagnosis present

## 2019-08-04 LAB — CBC WITH DIFFERENTIAL/PLATELET
Abs Immature Granulocytes: 0.02 10*3/uL (ref 0.00–0.07)
Basophils Absolute: 0 10*3/uL (ref 0.0–0.1)
Basophils Relative: 0 %
Eosinophils Absolute: 0.1 10*3/uL (ref 0.0–0.5)
Eosinophils Relative: 1 %
HCT: 48.9 % (ref 39.0–52.0)
Hemoglobin: 16.1 g/dL (ref 13.0–17.0)
Immature Granulocytes: 0 %
Lymphocytes Relative: 25 %
Lymphs Abs: 1.9 10*3/uL (ref 0.7–4.0)
MCH: 29.8 pg (ref 26.0–34.0)
MCHC: 32.9 g/dL (ref 30.0–36.0)
MCV: 90.4 fL (ref 80.0–100.0)
Monocytes Absolute: 0.5 10*3/uL (ref 0.1–1.0)
Monocytes Relative: 6 %
Neutro Abs: 5.1 10*3/uL (ref 1.7–7.7)
Neutrophils Relative %: 68 %
Platelets: 279 10*3/uL (ref 150–400)
RBC: 5.41 MIL/uL (ref 4.22–5.81)
RDW: 13.4 % (ref 11.5–15.5)
WBC: 7.5 10*3/uL (ref 4.0–10.5)
nRBC: 0 % (ref 0.0–0.2)

## 2019-08-04 LAB — URINALYSIS, MICROSCOPIC (REFLEX): Bacteria, UA: NONE SEEN

## 2019-08-04 LAB — BASIC METABOLIC PANEL
Anion gap: 10 (ref 5–15)
BUN: 11 mg/dL (ref 6–20)
CO2: 24 mmol/L (ref 22–32)
Calcium: 9.7 mg/dL (ref 8.9–10.3)
Chloride: 95 mmol/L — ABNORMAL LOW (ref 98–111)
Creatinine, Ser: 1 mg/dL (ref 0.61–1.24)
GFR calc Af Amer: >60 mL/min (ref >60)
GFR calc non Af Amer: >60 mL/min (ref >60)
Glucose, Bld: 559 mg/dL (ref 70–99)
Potassium: 4.5 mmol/L (ref 3.5–5.1)
Sodium: 129 mmol/L — ABNORMAL LOW (ref 135–145)

## 2019-08-04 LAB — LIPASE, BLOOD: Lipase: 85 U/L — ABNORMAL HIGH (ref 11–51)

## 2019-08-04 LAB — HEPATIC FUNCTION PANEL
ALT: 38 U/L (ref 0–44)
AST: 23 U/L (ref 15–41)
Albumin: 3.7 g/dL (ref 3.5–5.0)
Alkaline Phosphatase: 92 U/L (ref 38–126)
Bilirubin, Direct: 0.1 mg/dL (ref 0.0–0.2)
Indirect Bilirubin: 0.4 mg/dL (ref 0.3–0.9)
Total Bilirubin: 0.5 mg/dL (ref 0.3–1.2)
Total Protein: 8.6 g/dL — ABNORMAL HIGH (ref 6.5–8.1)

## 2019-08-04 LAB — URINALYSIS, ROUTINE W REFLEX MICROSCOPIC
Bilirubin Urine: NEGATIVE
Glucose, UA: >=500 mg/dL — AB
Ketones, ur: NEGATIVE mg/dL
Leukocytes,Ua: NEGATIVE
Nitrite: NEGATIVE
Protein, ur: NEGATIVE mg/dL
Specific Gravity, Urine: <1.005 — ABNORMAL LOW (ref 1.005–1.030)
pH: 6.5 (ref 5.0–8.0)

## 2019-08-04 LAB — CBG MONITORING, ED
Glucose-Capillary: >600 mg/dL (ref 70–99)
Glucose-Capillary: 352 mg/dL — ABNORMAL HIGH (ref 70–99)

## 2019-08-04 MED ORDER — INSULIN ASPART PROT & ASPART (70-30 MIX) 100 UNIT/ML ~~LOC~~ SUSP
10.0000 [IU] | Freq: Once | SUBCUTANEOUS | Status: AC
  Administered 2019-08-04: 10 [IU] via SUBCUTANEOUS

## 2019-08-04 MED ORDER — INSULIN ASPART PROT & ASPART (70-30 MIX) 100 UNIT/ML ~~LOC~~ SUSP
SUBCUTANEOUS | Status: AC
  Filled 2019-08-04: qty 10

## 2019-08-04 MED ORDER — SODIUM CHLORIDE 0.9 % IV BOLUS
1000.0000 mL | Freq: Once | INTRAVENOUS | Status: AC
  Administered 2019-08-04: 1000 mL via INTRAVENOUS

## 2019-08-04 MED ORDER — METFORMIN HCL 1000 MG PO TABS: 1000.0000 mg | ORAL_TABLET | Freq: Two times a day (BID) | ORAL | 2 refills | Status: DC

## 2019-08-04 NOTE — ED Provider Notes (Signed)
[facility] EMERGENCY DEPARTMENT Provider Note   CSN: [id number] Arrival date & time: 08/04/19  1527     History   Chief Complaint Chief Complaint  Patient presents with  . Urinary Frequency    HPI Vernon Floyd is a 48 y.o. male.     The history is provided by the patient.  Hyperglycemia Blood sugar level PTA:  >600 Severity:  Moderate Onset quality:  Gradual Timing:  Constant Progression:  Unchanged Chronicity:  New Current diabetic therapy:  No longer taking any medications for the past 6 months  Context: noncompliance   Relieved by:  Nothing Associated symptoms: dehydration, increased thirst and polyuria   Associated symptoms: no abdominal pain, no altered mental status, no blurred vision, no chest pain, no confusion, no dysuria, no fever, no shortness of breath and no vomiting   Risk factors: no hx of DKA     Past Medical History:  Diagnosis Date  . Diabetes mellitus without complication ([place])   . Hypertension   . Obesity     Patient Active Problem List   Diagnosis Date Noted  . Exposure to STD 09/29/2016    Past Surgical History:  Procedure Laterality Date  . KNEE ARTHROSCOPY          Home Medications    Prior to Admission medications   Medication Sig Start Date End Date Taking? Authorizing Provider  Semaglutide (OZEMPIC, 1 MG/DOSE, [place]) Inject into the skin.   Yes [provider]  metFORMIN (GLUCOPHAGE) 1000 MG tablet Take 1 tablet (1,000 mg total) by mouth 2 (two) times daily. 08/04/19   Kristyna Bradstreet, DO  PRESCRIPTION MEDICATION Take 1 tablet by mouth daily. Cholesterol    [provider]  sitaGLIPtin-metformin (JANUMET) 50-1000 MG tablet Take 1 tablet by mouth 2 (two) times daily with a meal.    [provider]    Family History No family history on file.  Social History Social History   Tobacco Use  . Smoking status: Never Smoker  . Smokeless tobacco: Never Used  Substance Use Topics  . Alcohol  use: Yes    Comment: "not in a while"  . Drug use: No     Allergies   Patient has no known allergies.   Review of Systems Review of Systems  Constitutional: Negative for chills and fever.  HENT: Negative for ear pain and sore throat.   Eyes: Negative for blurred vision, pain and visual disturbance.  Respiratory: Negative for cough and shortness of breath.   Cardiovascular: Negative for chest pain and palpitations.  Gastrointestinal: Negative for abdominal pain and vomiting.  Endocrine: Positive for polydipsia and polyuria.  Genitourinary: Positive for urgency. Negative for dysuria and hematuria.  Musculoskeletal: Negative for arthralgias and back pain.  Skin: Negative for color change and rash.  Neurological: Negative for seizures and syncope.  Psychiatric/Behavioral: Negative for confusion.  All other systems reviewed and are negative.    Physical Exam Updated Vital Signs BP 128/83 (BP Location: Right Arm)   Pulse 86   Temp 99.4 F (37.4 C) (Oral)   Resp 20   Ht 5\' 11"  (1.803 m)   Wt 129.3 kg   SpO2 98%   BMI 39.75 kg/m   Physical Exam Vitals signs and nursing note reviewed.  Constitutional:      General: He is not in acute distress.    Appearance: He is well-developed. He is not ill-appearing.  HENT:     Head: Normocephalic and atraumatic.     Nose: Nose  normal.     Mouth/Throat:     Mouth: Mucous membranes are moist.  Eyes:     Extraocular Movements: Extraocular movements intact.     Conjunctiva/sclera: Conjunctivae normal.     Pupils: Pupils are equal, round, and reactive to light.  Neck:     Musculoskeletal: Neck supple.  Cardiovascular:     Rate and Rhythm: Normal rate and regular rhythm.     Heart sounds: No murmur.  Pulmonary:     Effort: Pulmonary effort is normal. No respiratory distress.     Breath sounds: Normal breath sounds.  Abdominal:     General: There is no distension.     Palpations: Abdomen is soft.     Tenderness: There is no  abdominal tenderness.  Musculoskeletal: Normal range of motion.  Skin:    General: Skin is warm and dry.     Capillary Refill: Capillary refill takes less than 2 seconds.  Neurological:     General: No focal deficit present.     Mental Status: He is alert.  Psychiatric:        Mood and Affect: Mood normal.      ED Treatments / Results  Labs (all labs ordered are listed, but only abnormal results are displayed) Labs Reviewed  URINALYSIS, ROUTINE W REFLEX MICROSCOPIC - Abnormal; Notable for the following components:      Result Value   Specific Gravity, Urine <1.005 (*)    Glucose, UA >=500 (*)    Hgb urine dipstick TRACE (*)    All other components within normal limits  BASIC METABOLIC PANEL - Abnormal; Notable for the following components:   Sodium 129 (*)    Chloride 95 (*)    Glucose, Bld 559 (*)    All other components within normal limits  HEPATIC FUNCTION PANEL - Abnormal; Notable for the following components:   Total Protein 8.6 (*)    All other components within normal limits  LIPASE, BLOOD - Abnormal; Notable for the following components:   Lipase 85 (*)    All other components within normal limits  CBG MONITORING, ED - Abnormal; Notable for the following components:   Glucose-Capillary >600 (*)    All other components within normal limits  CBG MONITORING, ED - Abnormal; Notable for the following components:   Glucose-Capillary 352 (*)    All other components within normal limits  CBC WITH DIFFERENTIAL/PLATELET  URINALYSIS, MICROSCOPIC (REFLEX)  I-STAT VENOUS BLOOD GAS, ED    EKG None  Radiology No results found.  Procedures .Critical Care Performed by: Virgina Norfolkuratolo, Mariadelaluz Gug[place], DO Authorized by: Virgina Norfolkuratolo, Tyliyah Mcmeekin, DO   Critical care provider statement:    Critical care time (minutes):  45   Critical care was necessary to treat or prevent imminent or life-threatening deterioration of the following conditions:  Metabolic crisis   Critical care was time spent  personally by me on the following activities:  Blood draw for specimens, development of treatment plan with patient or surrogate, evaluation of patient's response to treatment, examination of patient, ordering and performing treatments and interventions, ordering and review of laboratory studies, pulse oximetry, ordering and review of radiographic studies, review of old charts, re-evaluation of patient's condition and obtaining history from patient or surrogate   I assumed direction of critical care for this patient from another provider in my specialty: no     (including critical care time)  Medications Ordered in ED Medications  sodium chloride 0.9 % bolus 1,000 mL (0 mLs Intravenous Stopped 08/04/19 1856)  sodium chloride 0.9 % bolus 1,000 mL (0 mLs Intravenous Stopped 08/04/19 1856)  insulin aspart protamine- aspart (NOVOLOG MIX 70/30) injection 10 Units (10 Units Subcutaneous Given 08/04/19 1855)     Initial Impression / Assessment and Plan / ED Course  I have reviewed the triage vital signs and the nursing notes.  Pertinent labs & imaging results that were available during my care of the patient were reviewed by me and considered in my medical decision making (see chart for details).     Hasnain Manheim is a 49 year old male history of diabetes, hypertension who presents to the ED with increased urination, hyperglycemia.  Blood sugar elevated upon arrival greater than 600.  However patient with normal vitals.  No fever.  No signs to suggest DKA on blood work.  Blood sugar was 559.  Improved with 2 L of IV fluids, IV insulin.  Blood sugar went down to 350.  No significant leukocytosis, anemia, electrolyte abnormality otherwise.  No urinary tract infection.  Patient had previously been on oral educations for diabetes but had not been on them for the last 6 months.  Gave extensive education about diet and weight loss.  Will restart metformin.  He will follow-up with your primary care doctor for  new diabetic care.  Given return precautions and discharged from ED in good condition.  This chart was dictated using voice recognition software.  Despite best efforts to proofread,  errors can occur which can change the documentation meaning.    Final Clinical Impressions(s) / ED Diagnoses   Final diagnoses:  Hyperglycemia    ED Discharge Orders         Ordered    metFORMIN (GLUCOPHAGE) 1000 MG tablet  2 times daily     08/04/19 2005           Virgina Norfolk, DO 08/04/19 2007

## 2019-08-04 NOTE — ED Triage Notes (Signed)
Pt reports having dry mouth and frequent urination x 3 days. Pt states he has not been taking his diabetes or B/P medication for almost a year.

## 2019-09-08 ENCOUNTER — Ambulatory Visit: Payer: BLUE CROSS/BLUE SHIELD | Attending: Family Medicine | Admitting: Family Medicine

## 2019-09-08 ENCOUNTER — Encounter: Payer: Self-pay | Admitting: Family Medicine

## 2019-09-08 ENCOUNTER — Other Ambulatory Visit: Payer: Self-pay

## 2019-09-08 VITALS — BP 138/82 | HR 87 | Temp 98.3°F | Ht 71.0 in | Wt 284.0 lb

## 2019-09-08 DIAGNOSIS — G4733 Obstructive sleep apnea (adult) (pediatric): Secondary | ICD-10-CM

## 2019-09-08 DIAGNOSIS — I10 Essential (primary) hypertension: Secondary | ICD-10-CM | POA: Diagnosis not present

## 2019-09-08 DIAGNOSIS — R748 Abnormal levels of other serum enzymes: Secondary | ICD-10-CM

## 2019-09-08 DIAGNOSIS — E1165 Type 2 diabetes mellitus with hyperglycemia: Secondary | ICD-10-CM

## 2019-09-08 DIAGNOSIS — Z6839 Body mass index (BMI) 39.0-39.9, adult: Secondary | ICD-10-CM

## 2019-09-08 LAB — POCT GLYCOSYLATED HEMOGLOBIN (HGB A1C): HbA1c, POC (controlled diabetic range): 9.4 % — AB (ref 0.0–7.0)

## 2019-09-08 LAB — GLUCOSE, POCT (MANUAL RESULT ENTRY): POC Glucose: 245 mg/dl — AB (ref 70–99)

## 2019-09-08 MED ORDER — INSULIN PEN NEEDLE 31G X 5 MM MISC: 1.0000 | Freq: Every day | 5 refills | Status: DC

## 2019-09-08 MED ORDER — LISINOPRIL 5 MG PO TABS: 5.0000 mg | ORAL_TABLET | Freq: Every day | ORAL | 1 refills | Status: DC

## 2019-09-08 MED ORDER — ATORVASTATIN CALCIUM 20 MG PO TABS: 20.0000 mg | ORAL_TABLET | Freq: Every day | ORAL | 1 refills | Status: DC

## 2019-09-08 MED ORDER — OZEMPIC (1 MG/DOSE) 2 MG/1.5ML ~~LOC~~ SOPN: 1.0000 mg | PEN_INJECTOR | SUBCUTANEOUS | 3 refills | Status: DC

## 2019-09-08 MED ORDER — GLIPIZIDE 10 MG PO TABS: 5.0000 mg | ORAL_TABLET | Freq: Two times a day (BID) | ORAL | 1 refills | Status: DC

## 2019-09-08 NOTE — Patient Instructions (Signed)
° °Calorie Counting for Weight Loss °Calories are units of energy. Your body needs a certain amount of calories from food to keep you going throughout the day. When you eat more calories than your body needs, your body stores the extra calories as fat. When you eat fewer calories than your body needs, your body burns fat to get the energy it needs. °Calorie counting means keeping track of how many calories you eat and drink each day. Calorie counting can be helpful if you need to lose weight. If you make sure to eat fewer calories than your body needs, you should lose weight. Ask your health care provider what a healthy weight is for you. °For calorie counting to work, you will need to eat the right number of calories in a day in order to lose a healthy amount of weight per week. A dietitian can help you determine how many calories you need in a day and will give you suggestions on how to reach your calorie goal. °· A healthy amount of weight to lose per week is usually 1-2 lb (0.5-0.9 kg). This usually means that your daily calorie intake should be reduced by 500-750 calories. °· Eating 1,200 - 1,500 calories per day can help most women lose weight. °· Eating 1,500 - 1,800 calories per day can help most men lose weight. °What is my plan? °My goal is to have __________ calories per day. °If I have this many calories per day, I should lose around __________ pounds per week. °What do I need to know about calorie counting? °In order to meet your daily calorie goal, you will need to: °· Find out how many calories are in each food you would like to eat. Try to do this before you eat. °· Decide how much of the food you plan to eat. °· Write down what you ate and how many calories it had. Doing this is called keeping a food log. °To successfully lose weight, it is important to balance calorie counting with a healthy lifestyle that includes regular activity. Aim for 150 minutes of moderate exercise (such as walking) or 75  minutes of vigorous exercise (such as running) each week. °Where do I find calorie information? ° °The number of calories in a food can be found on a Nutrition Facts label. If a food does not have a Nutrition Facts label, try to look up the calories online or ask your dietitian for help. °Remember that calories are listed per serving. If you choose to have more than one serving of a food, you will have to multiply the calories per serving by the amount of servings you plan to eat. For example, the label on a package of bread might say that a serving size is 1 slice and that there are 90 calories in a serving. If you eat 1 slice, you will have eaten 90 calories. If you eat 2 slices, you will have eaten 180 calories. °How do I keep a food log? °Immediately after each meal, record the following information in your food log: °· What you ate. Don't forget to include toppings, sauces, and other extras on the food. °· How much you ate. This can be measured in cups, ounces, or number of items. °· How many calories each food and drink had. °· The total number of calories in the meal. °Keep your food log near you, such as in a small notebook in your pocket, or use a mobile app or website. Some programs will   calculate calories for you and show you how many calories you have left for the day to meet your goal. °What are some calorie counting tips? ° °· Use your calories on foods and drinks that will fill you up and not leave you hungry: °? Some examples of foods that fill you up are nuts and nut butters, vegetables, lean proteins, and high-fiber foods like whole grains. High-fiber foods are foods with more than 5 g fiber per serving. °? Drinks such as sodas, specialty coffee drinks, alcohol, and juices have a lot of calories, yet do not fill you up. °· Eat nutritious foods and avoid empty calories. Empty calories are calories you get from foods or beverages that do not have many vitamins or protein, such as candy, sweets, and  soda. It is better to have a nutritious high-calorie food (such as an avocado) than a food with few nutrients (such as a bag of chips). °· Know how many calories are in the foods you eat most often. This will help you calculate calorie counts faster. °· Pay attention to calories in drinks. Low-calorie drinks include water and unsweetened drinks. °· Pay attention to nutrition labels for "low fat" or "fat free" foods. These foods sometimes have the same amount of calories or more calories than the full fat versions. They also often have added sugar, starch, or salt, to make up for flavor that was removed with the fat. °· Find a way of tracking calories that works for you. Get creative. Try different apps or programs if writing down calories does not work for you. °What are some portion control tips? °· Know how many calories are in a serving. This will help you know how many servings of a certain food you can have. °· Use a measuring cup to measure serving sizes. You could also try weighing out portions on a kitchen scale. With time, you will be able to estimate serving sizes for some foods. °· Take some time to put servings of different foods on your favorite plates, bowls, and cups so you know what a serving looks like. °· Try not to eat straight from a bag or box. Doing this can lead to overeating. Put the amount you would like to eat in a cup or on a plate to make sure you are eating the right portion. °· Use smaller plates, glasses, and bowls to prevent overeating. °· Try not to multitask (for example, watch TV or use your computer) while eating. If it is time to eat, sit down at a table and enjoy your food. This will help you to know when you are full. It will also help you to be aware of what you are eating and how much you are eating. °What are tips for following this plan? °Reading food labels °· Check the calorie count compared to the serving size. The serving size may be smaller than what you are used to  eating. °· Check the source of the calories. Make sure the food you are eating is high in vitamins and protein and low in saturated and trans fats. °Shopping °· Read nutrition labels while you shop. This will help you make healthy decisions before you decide to purchase your food. °· Make a grocery list and stick to it. °Cooking °· Try to cook your favorite foods in a healthier way. For example, try baking instead of frying. °· Use low-fat dairy products. °Meal planning °· Use more fruits and vegetables. Half of your plate should be   fruits and vegetables. °· Include lean proteins like poultry and fish. °How do I count calories when eating out? °· Ask for smaller portion sizes. °· Consider sharing an entree and sides instead of getting your own entree. °· If you get your own entree, eat only half. Ask for a box at the beginning of your meal and put the rest of your entree in it so you are not tempted to eat it. °· If calories are listed on the menu, choose the lower calorie options. °· Choose dishes that include vegetables, fruits, whole grains, low-fat dairy products, and lean protein. °· Choose items that are boiled, broiled, grilled, or steamed. Stay away from items that are buttered, battered, fried, or served with cream sauce. Items labeled "crispy" are usually fried, unless stated otherwise. °· Choose water, low-fat milk, unsweetened iced tea, or other drinks without added sugar. If you want an alcoholic beverage, choose a lower calorie option such as a glass of wine or light beer. °· Ask for dressings, sauces, and syrups on the side. These are usually high in calories, so you should limit the amount you eat. °· If you want a salad, choose a garden salad and ask for grilled meats. Avoid extra toppings like bacon, cheese, or fried items. Ask for the dressing on the side, or ask for olive oil and vinegar or lemon to use as dressing. °· Estimate how many servings of a food you are given. For example, a serving of  cooked rice is ½ cup or about the size of half a baseball. Knowing serving sizes will help you be aware of how much food you are eating at restaurants. The list below tells you how big or small some common portion sizes are based on everyday objects: °? 1 oz--4 stacked dice. °? 3 oz--1 deck of cards. °? 1 tsp--1 die. °? 1 Tbsp--½ a ping-pong ball. °? 2 Tbsp--1 ping-pong ball. °? ½ cup--½ baseball. °? 1 cup--1 baseball. °Summary °· Calorie counting means keeping track of how many calories you eat and drink each day. If you eat fewer calories than your body needs, you should lose weight. °· A healthy amount of weight to lose per week is usually 1-2 lb (0.5-0.9 kg). This usually means reducing your daily calorie intake by 500-750 calories. °· The number of calories in a food can be found on a Nutrition Facts label. If a food does not have a Nutrition Facts label, try to look up the calories online or ask your dietitian for help. °· Use your calories on foods and drinks that will fill you up, and not on foods and drinks that will leave you hungry. °· Use smaller plates, glasses, and bowls to prevent overeating. °This information is not intended to replace advice given to you by your health care provider. Make sure you discuss any questions you have with your health care provider. °Document Released: 10/16/2005 Document Revised: 07/05/2018 Document Reviewed: 09/15/2016 °Elsevier Patient Education © 2020 [employer] ° °

## 2019-09-08 NOTE — Progress Notes (Signed)
New Patient Office Visit  Subjective:  Patient ID: Vernon Floyd, male    DOB: 05/03/1971  Age: 48 y.o. MRN: [id number]  CC:  Chief Complaint  Patient presents with  . Diabetes    HPI Vernon Floyd is a 48 year old male with a history of type 2 diabetes mellitus (A1c 9.4), hypertension, and obstructive sleep apnea who presents today to establish care.  [facility] but patient states that they will no longer see him due to his insurance change.  Earlier this year the patient had a sleep study and was diagnosed with obstructive sleep apnea.  Due to the fit of his BiPAP mask and the increased air flow, the patient states that he rarely uses it even though he states that he feels better when he does.    The patients diabetes is uncontrolled with an A1c of 9.4.  He reports that he is currently not taking any medications.  He stopped his metformin because he believes that it was causing problems with his eyesight and he has heard negative things about it.  He previously took Ozempic which he states that he tolerated well.  He occasionally checks his blood sugar at home and it ranges from 125-220.  He denies any episodes of hypoglycemia.  Patient states that he is not compliant with a diabetic diet.  He is due for an eye exam and a foot exam.    The patients blood pressure is elevated in the office today, even after manual recheck.  He does not monitor his blood pressure at home.  He does not get any exercise.  Patient expresses concern that he does not want to be placed on a medication that would negatively impact his sexual function. Denies chest pain, SOB.    Past Medical History:  Diagnosis Date  . Diabetes mellitus without complication (HCC)   . Hypertension   . Obesity     Past Surgical History:  Procedure Laterality Date  . KNEE ARTHROSCOPY      No family history on file.  Social History   Socioeconomic History  . Marital status: Single   Spouse name: Not on file  . Number of children: Not on file  . Years of education: Not on file  . Highest education level: Not on file  Occupational History  . Not on file  Social Needs  . Financial resource strain: Not on file  . Food insecurity    Worry: Not on file    Inability: Not on file  . Transportation needs    Medical: Not on file    Non-medical: Not on file  Tobacco Use  . Smoking status: Never Smoker  . Smokeless tobacco: Never Used  Substance and Sexual Activity  . Alcohol use: Yes    Comment: "not in a while"  . Drug use: No  . Sexual activity: Not on file  Lifestyle  . Physical activity    Days per week: Not on file    Minutes per session: Not on file  . Stress: Not on file  Relationships  . Social Musicianconnections    Talks on phone: Not on file    Gets together: Not on file    Attends religious service: Not on file    Active member of club or organization: Not on file    Attends meetings of clubs or organizations: Not on file    Relationship status: Not on file  . Intimate partner violence  Fear of current or ex partner: Not on file    Emotionally abused: Not on file    Physically abused: Not on file    Forced sexual activity: Not on file  Other Topics Concern  . Not on file  Social History Narrative  . Not on file    ROS Review of Systems  Constitutional: Negative for fatigue, fever and unexpected weight change.  HENT: Negative for congestion, rhinorrhea, sinus pressure and sinus pain.   Eyes: Negative for visual disturbance.  Respiratory: Negative for cough, chest tightness and shortness of breath.   Cardiovascular: Negative for chest pain, palpitations and leg swelling.  Gastrointestinal: Negative for abdominal distention, abdominal pain, constipation, diarrhea, nausea and vomiting.  Endocrine: Negative for polydipsia and polyuria.  Genitourinary: Negative for decreased urine volume, difficulty urinating and dysuria.  Musculoskeletal: Negative  for arthralgias and myalgias.  Skin: Negative for color change and rash.  Neurological: Negative for dizziness, tremors, weakness and numbness.  Hematological: Does not bruise/bleed easily.  Psychiatric/Behavioral: Negative for agitation and behavioral problems.    Objective:   Today's Vitals: BP 138/82   Pulse 87   Temp 98.3 F (36.8 C) (Oral)   Ht 5\' 11"  (1.803 m)   Wt 284 lb (128.8 kg)   SpO2 96%   BMI 39.61 kg/m   Physical Exam Vitals signs and nursing note reviewed.  Constitutional:      Appearance: Normal appearance. He is obese. He is not ill-appearing.  HENT:     Head: Normocephalic and atraumatic.  Eyes:     Extraocular Movements: Extraocular movements intact.     Conjunctiva/sclera: Conjunctivae normal.     Pupils: Pupils are equal, round, and reactive to light.  Neck:     Musculoskeletal: Normal range of motion and neck supple.  Cardiovascular:     Rate and Rhythm: Normal rate and regular rhythm.     Pulses: Normal pulses.     Heart sounds: Normal heart sounds. No murmur.  Pulmonary:     Effort: Pulmonary effort is normal. No respiratory distress.     Breath sounds: Normal breath sounds.  Abdominal:     General: Bowel sounds are normal. There is no distension.     Palpations: Abdomen is soft.     Tenderness: There is no abdominal tenderness.  Musculoskeletal: Normal range of motion.        General: No swelling.  Skin:    General: Skin is warm and dry.     Findings: No rash.  Neurological:     Mental Status: He is alert and oriented to person, place, and time.  Psychiatric:        Mood and Affect: Mood normal.        Behavior: Behavior normal.     Assessment & Plan:   1. Type 2 diabetes mellitus with hyperglycemia, without long-term current use of insulin (HCC) Uncontrolled with an A1c of 9.4 Begin glipizide Begin Ozempic Begin atorvastatin Counseled on Diabetic diet, my plate method, 829 minutes of moderate intensity exercise/week Keep blood  sugar logs with fasting goals of 80-120 mg/dl, random of less than 180 and in the event of sugars less than 60 mg/dl or greater than 400 mg/dl please notify the clinic ASAP. It is recommended that you undergo annual eye exams. Foot exam performed today. - Glucose (CBG) - HgB A1c - glipiZIDE (GLUCOTROL) 10 MG tablet; Take 0.5 tablets (5 mg total) by mouth 2 (two) times daily before a meal.  Dispense: 180 tablet; Refill: 1 -  atorvastatin (LIPITOR) 20 MG tablet; Take 1 tablet (20 mg total) by mouth daily.  Dispense: 90 tablet; Refill: 1 - Microalbumin/Creatinine Ratio, Urine - Lipase - Semaglutide, 1 MG/DOSE, (OZEMPIC, 1 MG/DOSE,) 2 MG/1.5ML SOPN; Inject 1 mg into the skin once a week.  Dispense: 4 pen; Refill: 3  2. Essential hypertension Uncontrolled Blood pressure is elevated today.  Begin lisinopril for blood pressure control and kidney protection. Counseled on blood pressure goal of less than 130/80, low-sodium, DASH diet, medication compliance, 150 minutes of moderate intensity exercise per week. Discussed medication compliance, adverse effects. - lisinopril (ZESTRIL) 5 MG tablet; Take 1 tablet (5 mg total) by mouth daily.  Dispense: 90 tablet; Refill: 1  3. Obstructive sleep apnea syndrome Uncontrolled - non compliant with BiPAP. - Ambulatory referral to Pulmonology  4. Class 2 severe obesity due to excess calories with serious comorbidity and body mass index (BMI) of 39.0 to 39.9 in adult [facility]) Counseled on the importance of 150 minutes of moderate intensity exercise per week.  Educated about diet changes for weight loss.  5. Elevated lipase Elevated at ED visit on 08/04/2019 Recheck lipase level.n   Outpatient Encounter Medications as of 09/08/2019  Medication Sig  . atorvastatin (LIPITOR) 20 MG tablet Take 1 tablet (20 mg total) by mouth daily.  Marland Kitchen glipiZIDE (GLUCOTROL) 10 MG tablet Take 0.5 tablets (5 mg total) by mouth 2 (two) times daily before a meal.  . Insulin Pen Needle  31G X 5 MM MISC 1 each by Does not apply route at bedtime.  Marland Kitchen lisinopril (ZESTRIL) 5 MG tablet Take 1 tablet (5 mg total) by mouth daily.  . metFORMIN (GLUCOPHAGE) 1000 MG tablet Take 1 tablet (1,000 mg total) by mouth 2 (two) times daily. (Patient not taking: Reported on 09/08/2019)  . PRESCRIPTION MEDICATION Take 1 tablet by mouth daily. Cholesterol  . Semaglutide, 1 MG/DOSE, (OZEMPIC, 1 MG/DOSE,) 2 MG/1.5ML SOPN Inject 1 mg into the skin once a week.  . sitaGLIPtin-metformin (JANUMET) 50-1000 MG tablet Take 1 tablet by mouth 2 (two) times daily with a meal.  . [DISCONTINUED] Semaglutide (OZEMPIC, 1 MG/DOSE, [place]) Inject into the skin.   No facility-administered encounter medications on file as of 09/08/2019.     Follow-up: No follow-ups on file.   Janann August, RN

## 2019-09-08 NOTE — Progress Notes (Signed)
Patient states that metformin was making him loose eyesight.

## 2019-09-09 ENCOUNTER — Encounter: Payer: Self-pay | Admitting: Family Medicine

## 2019-09-09 LAB — MICROALBUMIN / CREATININE URINE RATIO
Creatinine, Urine: 121.6 mg/dL
Microalb/Creat Ratio: 10 mg/g creat (ref 0–29)
Microalbumin, Urine: 11.6 ug/mL

## 2019-09-09 LAB — LIPASE: Lipase: 29 U/L (ref 13–78)

## 2020-01-04 IMAGING — CR DG CHEST 2V
2 series · 2 of 2 positions shown · non-contrast
Comparison: None.

CLINICAL DATA: 46-year-old male with chest pain.

EXAM:
CHEST - 2 VIEW

[chest pa]
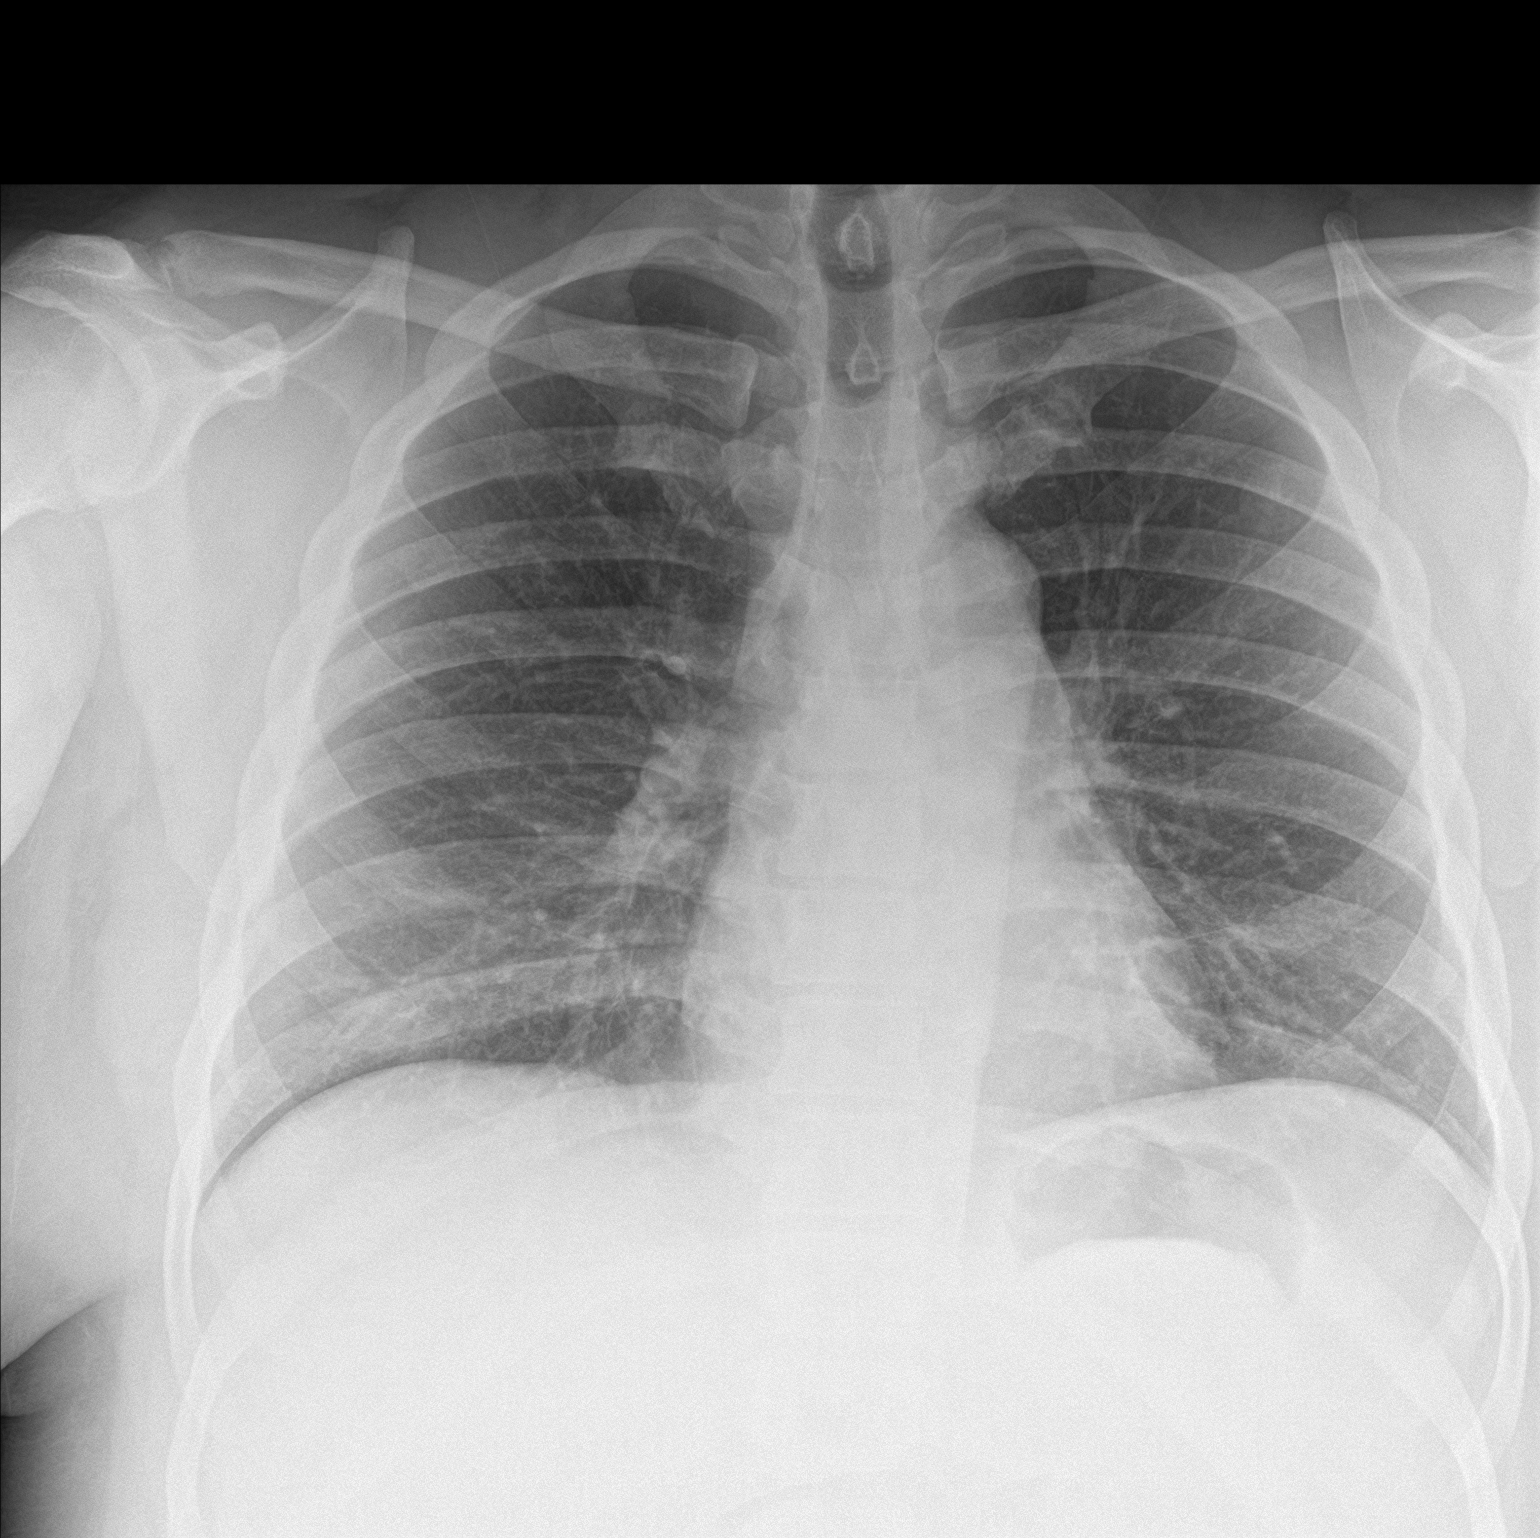

[chest lat]
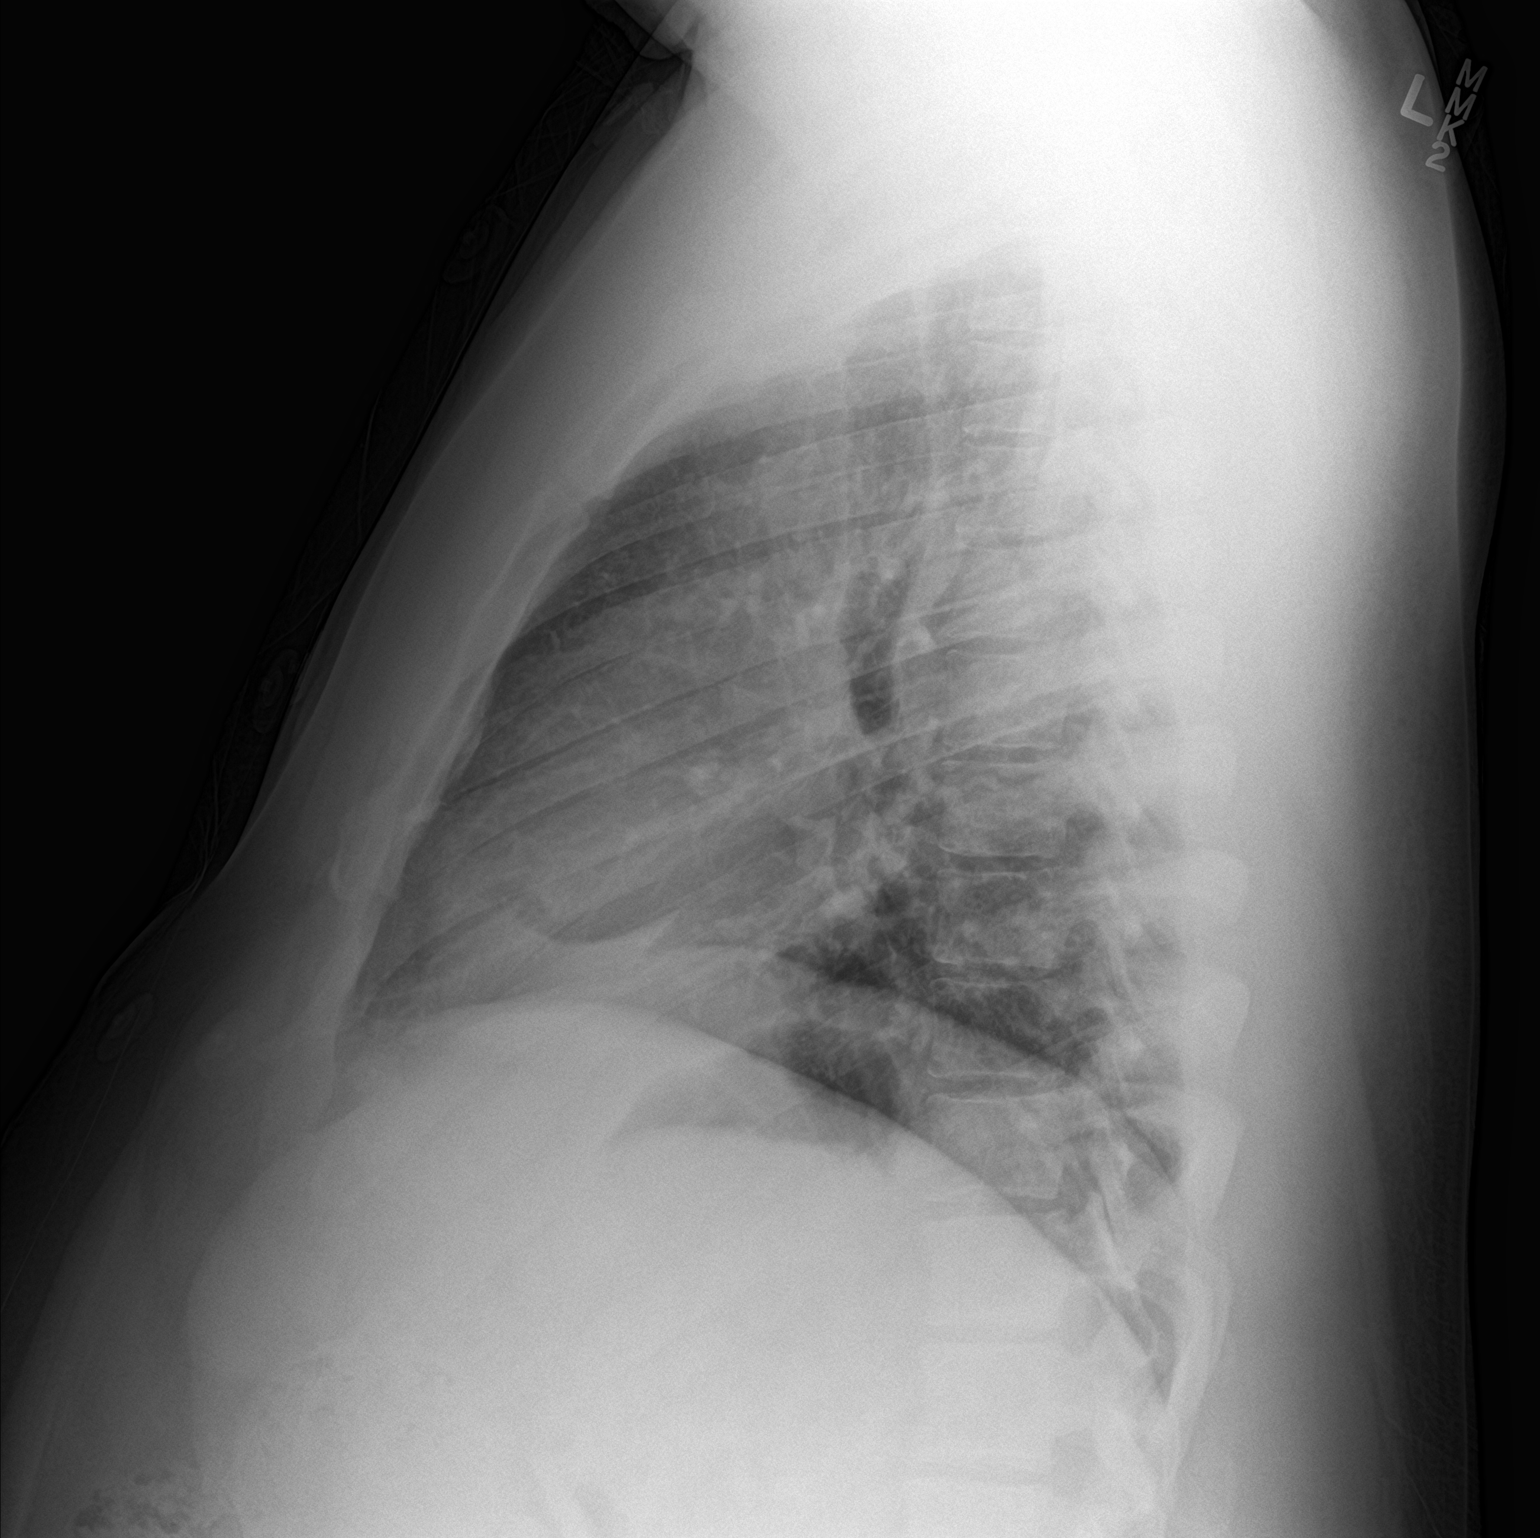

[2 of 2 positions shown; findings below may reference images not displayed]

FINDINGS: The heart size and mediastinal contours are within normal limits.
Both lungs are clear. The visualized skeletal structures are
unremarkable.
IMPRESSION: No active cardiopulmonary disease.

## 2020-02-25 ENCOUNTER — Other Ambulatory Visit: Payer: Self-pay

## 2020-02-25 ENCOUNTER — Emergency Department (HOSPITAL_COMMUNITY)
Admission: EM | Admit: 2020-02-25 | Discharge: 2020-02-25 | Disposition: A | Discharge status: Home / Self Care | Payer: BLUE CROSS/BLUE SHIELD

## 2020-02-25 ENCOUNTER — Encounter (HOSPITAL_BASED_OUTPATIENT_CLINIC_OR_DEPARTMENT_OTHER): Payer: Self-pay

## 2020-02-25 ENCOUNTER — Emergency Department (HOSPITAL_BASED_OUTPATIENT_CLINIC_OR_DEPARTMENT_OTHER)
Admission: EM | Admit: 2020-02-25 | Discharge: 2020-02-25 | Disposition: A | Discharge status: Home / Self Care | Payer: BLUE CROSS/BLUE SHIELD | Attending: Emergency Medicine | Admitting: Emergency Medicine

## 2020-02-25 DIAGNOSIS — Z79899 Other long term (current) drug therapy: Secondary | ICD-10-CM | POA: Diagnosis not present

## 2020-02-25 DIAGNOSIS — M5441 Lumbago with sciatica, right side: Secondary | ICD-10-CM | POA: Diagnosis not present

## 2020-02-25 DIAGNOSIS — Z7984 Long term (current) use of oral hypoglycemic drugs: Secondary | ICD-10-CM | POA: Insufficient documentation

## 2020-02-25 DIAGNOSIS — E119 Type 2 diabetes mellitus without complications: Secondary | ICD-10-CM | POA: Insufficient documentation

## 2020-02-25 DIAGNOSIS — I10 Essential (primary) hypertension: Secondary | ICD-10-CM | POA: Insufficient documentation

## 2020-02-25 DIAGNOSIS — M545 Low back pain: Secondary | ICD-10-CM | POA: Diagnosis present

## 2020-02-25 MED ORDER — KETOROLAC TROMETHAMINE 30 MG/ML IJ SOLN
30.0000 mg | Freq: Once | INTRAMUSCULAR | Status: DC
  Filled 2020-02-25: qty 1

## 2020-02-25 MED ORDER — KETOROLAC TROMETHAMINE 30 MG/ML IJ SOLN
30.0000 mg | Freq: Once | INTRAMUSCULAR | Status: AC
  Administered 2020-02-25: 30 mg via INTRAMUSCULAR

## 2020-02-25 MED ORDER — METHOCARBAMOL 500 MG PO TABS: 500.0000 mg | ORAL_TABLET | Freq: Two times a day (BID) | ORAL | 0 refills | Status: DC

## 2020-02-25 NOTE — ED Triage Notes (Signed)
Pt c/o pain to lower back that radiates down right leg x 1 month-denies injury-states he has been seen by chiropractor and ortho-has MRI 5/7-NAD-steady gait

## 2020-02-25 NOTE — ED Notes (Signed)
Called for Pt to Triage. No answer

## 2020-02-25 NOTE — Discharge Instructions (Addendum)
Please use Robaxin as prescribed. Please use Tylenol or ibuprofen for pain.  You may use 600 mg ibuprofen every 6 hours or 1000 mg of Tylenol every 6 hours.  You may choose to alternate between the 2.  This would be most effective.  Not to exceed 4 g of Tylenol within 24 hours.  Not to exceed 3200 mg ibuprofen 24 hours.  Please keep your appointment with your PCP to have your MRI done.  Please return to ED if you have any new or concerning symptoms such as fevers with worsening back pain, peeing or pooping when you are not not meeting 2, numbness between your legs.   Please call the [place] [facility] to make a follow-up appointment to recheck your blood pressure and potentially represcribe your blood pressure medications.

## 2020-02-25 NOTE — ED Provider Notes (Signed)
[facility] EMERGENCY DEPARTMENT Provider Note   CSN: [id number] Arrival date & time: 02/25/20  1347     History Chief Complaint  Patient presents with  . Back Pain    Vernon Floyd is a 49 y.o. male.  HPI  Patient is 49 year old male with history of diabetes and hypertension not currently on his blood pressure medications.  Presented today with back pain which has been ongoing for 4 months.  He endorses episodes of pain that it is achy, constant, worse with movements.  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  He states that he sees a PCP who is ordered an outpatient MRI for early next month.  He states he has not taken any medications for his pain prior to arrival.  He has not taken any muscle relaxers NSAIDs or other pain medication.  Patient has any saddle anesthesia, bowel or bladder incontinence.  He states he does have some constipation but no other issues with BMs.  No history of cancer, no anticoagulation, he denies any weakness or numbness of lower extremities.  No history of IV drug use.     Past Medical History:  Diagnosis Date  . Diabetes mellitus without complication ([place])   . Hypertension   . Obesity     Patient Active Problem List   Diagnosis Date Noted  . Exposure to STD 09/29/2016    Past Surgical History:  Procedure Laterality Date  . KNEE ARTHROSCOPY         No family history on file.  Social History   Tobacco Use  . Smoking status: Never Smoker  . Smokeless tobacco: Never Used  Substance Use Topics  . Alcohol use: Yes    Comment: occ  . Drug use: Yes    Types: Marijuana    Home Medications Prior to Admission medications   Medication Sig Start Date End Date Taking? Authorizing Provider  atorvastatin (LIPITOR) 20 MG tablet Take 1 tablet (20 mg total) by mouth daily. 09/08/19   Charlott Rakes, MD  glipiZIDE  (GLUCOTROL) 10 MG tablet Take 0.5 tablets (5 mg total) by mouth 2 (two) times daily before a meal. 09/08/19   Newlin, Enobong, MD  Insulin Pen Needle 31G X 5 MM MISC 1 each by Does not apply route at bedtime. 09/08/19   Charlott Rakes, MD  lisinopril (ZESTRIL) 5 MG tablet Take 1 tablet (5 mg total) by mouth daily. 09/08/19   Charlott Rakes, MD  metFORMIN (GLUCOPHAGE) 1000 MG tablet Take 1 tablet (1,000 mg total) by mouth 2 (two) times daily. Patient not taking: Reported on 09/08/2019 08/04/19   Lennice Sites, DO  methocarbamol (ROBAXIN) 500 MG tablet Take 1 tablet (500 mg total) by mouth 2 (two) times daily. 02/25/20   Tedd Sias, PA  PRESCRIPTION MEDICATION Take 1 tablet by mouth daily. Cholesterol    [provider]  Semaglutide, 1 MG/DOSE, (OZEMPIC, 1 MG/DOSE,) 2 MG/1.5ML SOPN Inject 1 mg into the skin once a week. 09/08/19   Charlott Rakes, MD  sitaGLIPtin-metformin (JANUMET) 50-1000 MG tablet Take 1 tablet by mouth 2 (two) times daily with a meal.    [provider]    Allergies    Patient has no known allergies.  Review of Systems   Review of Systems  Constitutional: Negative for chills and fever.  HENT: Negative for congestion.   Respiratory: Negative for shortness of  breath.   Cardiovascular: Negative for chest pain.  Gastrointestinal: Negative for abdominal pain.  Musculoskeletal: Positive for back pain. Negative for neck pain.    Physical Exam Updated Vital Signs BP (!) 141/91   Pulse 91   Temp 98.4 F (36.9 C) (Oral)   Resp 18   Ht 5\' 11"  (1.803 m)   Wt 127.5 kg   SpO2 96%   BMI 39.19 kg/m   Physical Exam Vitals and nursing note reviewed.  Constitutional:      General: He is not in acute distress.    Comments: Patient appears uncomfortable but in no acute distress.  49 year old male, appears stated age, pleasant able answer questions appropriately and follow commands.  HENT:     Head: Normocephalic and atraumatic.     Nose: Nose normal.    Eyes:     General: No scleral icterus. Cardiovascular:     Rate and Rhythm: Normal rate and regular rhythm.     Pulses: Normal pulses.     Heart sounds: Normal heart sounds.  Pulmonary:     Effort: Pulmonary effort is normal. No respiratory distress.     Breath sounds: No wheezing.  Abdominal:     Palpations: Abdomen is soft.     Tenderness: There is no abdominal tenderness.  Musculoskeletal:     Cervical back: Normal range of motion.     Right lower leg: No edema.     Left lower leg: No edema.     Comments: No tenderness palpation of midline back.  There is right-sided muscular tenderness of the gluteus and right paralumbar musculature.  There are multiple trigger points and spasm present.  5/5 strength in bilateral knees flexion extension and hip flexion extension.  Skin:    General: Skin is warm and dry.     Capillary Refill: Capillary refill takes less than 2 seconds.  Neurological:     Mental Status: He is alert. Mental status is at baseline.     Comments: Alert and oriented to self, place, time and event.   Speech is fluent, clear without dysarthria or dysphasia.   Strength 5/5 in upper/lower extremities  Sensation intact in upper/lower extremities   Normal gait.  Negative Romberg. No pronator drift.  Normal finger-to-nose and feet tapping.  CN I not tested  CN II grossly intact visual fields bilaterally. Did not visualize posterior eye.   CN III, IV, VI PERRLA and EOMs intact bilaterally  CN V Intact sensation to sharp and light touch to the face  CN VII facial movements symmetric  CN VIII not tested  CN IX, X no uvula deviation, symmetric rise of soft palate  CN XI 5/5 SCM and trapezius strength bilaterally  CN XII Midline tongue protrusion, symmetric L/R movements   Patellar reflexes bilaterally 3+ and symmetric.  Psychiatric:        Mood and Affect: Mood normal.        Behavior: Behavior normal.     ED Results / Procedures / Treatments   Labs (all  labs ordered are listed, but only abnormal results are displayed) Labs Reviewed - No data to display  EKG None  Radiology No results found.  Procedures Procedures (including critical care time)  Medications Ordered in ED Medications  ketorolac (TORADOL) 30 MG/ML injection 30 mg (30 mg Intramuscular Given 02/25/20 1420)    ED Course  I have reviewed the triage vital signs and the nursing notes.  Pertinent labs & imaging results that were available during my care  of the patient were reviewed by me and considered in my medical decision making (see chart for details).    MDM Rules/Calculators/A&P                       Patient is 49 year old male with history of diabetes and hypertension not currently on his blood pressure medications.  Presented today with back pain which has been ongoing for 4 months.  He endorses episodes of pain that it is achy, constant, worse with movements.  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  He states that he sees a PCP who is ordered an outpatient MRI for early next month.  He states he has not taken any medications for his pain prior to arrival.  He has not taken any muscle relaxers NSAIDs or other pain medication.  Broad differential for back pain considered includes malignancy, disc herniation, spinal epidural abscess, spinal fracture, cauda equina, pyelonephritis, kidney stone, AAA, AD, pancreatitis, PE and PTX.   History without symptoms of urinary or stool retention or incontinence, neurologic changes such as sensation change or weakness lower extremities, coagulopathy or blood thinner use, is not elderly or with history of osteoporosis, denies any history of cancer, fever, IV drug use, weight changes (unexplained), or prolonged steroid use.   Physical exam most consistent with muscular strain. Doubt cauda equina or disc herniation d/t lack  of saddle anesthesia/bowel or bladder incontinence or urinary retention, normal gait and reassuring physical examination without neurologic deficits.   History is not supportive of kidney stone, AAA, AD, pancreatitis, PE or PTX. Patient has no CVA tenderness or urinary sx to suggest pyelonephritis or kidney stone.   Will manage patient conservatively at this time. NSAIDs, back exercises/stretches, heat therapy and follow up with PCP if symptoms do not resolve in 3-4 weeks. Patient offered muscle relaxer for comfort at night. Counseled on need to return to ED for fever, worsening or concerning symptoms. Patient agreeable to plan and states understanding of follow up plans and return precautions.    Vitals WNL at time of discharge and patient is no acute distress  Patient given Toradol shot.  Because of his history of diabetes and his elevated blood pressure he is a poor candidate for prednisone taper at this time.  I gave low back exercises, recommendations for conservative therapy, Tylenol, ibuprofen and recommend patient keep his appointment for his MRI that was ordered by his PCP. Given robaxin to use as needed.    Final Clinical Impression(s) / ED Diagnoses Final diagnoses:  Acute right-sided low back pain with right-sided sciatica    Rx / DC Orders ED Discharge Orders         Ordered    methocarbamol (ROBAXIN) 500 MG tablet  2 times daily     02/25/20 1419           Solon [place], [place] 02/25/20 1452    Linwood Dibbles, MD 02/26/20 [phone]

## 2020-07-21 ENCOUNTER — Other Ambulatory Visit: Payer: Self-pay

## 2020-07-21 ENCOUNTER — Encounter (HOSPITAL_BASED_OUTPATIENT_CLINIC_OR_DEPARTMENT_OTHER): Payer: Self-pay | Admitting: *Deleted

## 2020-07-21 ENCOUNTER — Emergency Department (HOSPITAL_BASED_OUTPATIENT_CLINIC_OR_DEPARTMENT_OTHER)
Admission: EM | Admit: 2020-07-21 | Discharge: 2020-07-21 | Disposition: A | Discharge status: Home / Self Care | Payer: BLUE CROSS/BLUE SHIELD | Attending: Emergency Medicine | Admitting: Emergency Medicine

## 2020-07-21 DIAGNOSIS — Z79899 Other long term (current) drug therapy: Secondary | ICD-10-CM | POA: Diagnosis not present

## 2020-07-21 DIAGNOSIS — I10 Essential (primary) hypertension: Secondary | ICD-10-CM | POA: Insufficient documentation

## 2020-07-21 DIAGNOSIS — Z760 Encounter for issue of repeat prescription: Secondary | ICD-10-CM | POA: Diagnosis not present

## 2020-07-21 DIAGNOSIS — Z794 Long term (current) use of insulin: Secondary | ICD-10-CM | POA: Insufficient documentation

## 2020-07-21 DIAGNOSIS — E119 Type 2 diabetes mellitus without complications: Secondary | ICD-10-CM | POA: Insufficient documentation

## 2020-07-21 DIAGNOSIS — E1165 Type 2 diabetes mellitus with hyperglycemia: Secondary | ICD-10-CM

## 2020-07-21 MED ORDER — OZEMPIC (1 MG/DOSE) 2 MG/1.5ML ~~LOC~~ SOPN: 1.0000 mg | PEN_INJECTOR | SUBCUTANEOUS | 0 refills | Status: DC

## 2020-07-21 MED ORDER — LISINOPRIL 5 MG PO TABS: 5.0000 mg | ORAL_TABLET | Freq: Every day | ORAL | 0 refills | Status: DC

## 2020-07-21 NOTE — ED Triage Notes (Signed)
Pt c/o out of DM and HTN meds x 1 month , appt with PMD oct 21

## 2020-07-21 NOTE — Discharge Instructions (Addendum)
Follow up with your doctor as soon as possible. Call and ask for an earlier appointment or cancellation list. Further refills will not be available from the emergency room.

## 2020-07-21 NOTE — ED Provider Notes (Signed)
[facility] EMERGENCY DEPARTMENT Provider Note   CSN: [id number] Arrival date & time: 07/21/20  1430     History Chief Complaint  Patient presents with  . Medication Refill    Vernon Floyd is a 49 y.o. male.   Ran out of meds 2 months ago, last seen by PCP earlier this year. Out of Ozempic, and Lisinopril, requesting refill. Patient is scheduled to see his PCP in 1 month. Reports his blood sugar has been high around 190, no other complaints or concerns.         Past Medical History:  Diagnosis Date  . Diabetes mellitus without complication ([facility])   . Hypertension   . Obesity     Patient Active Problem List   Diagnosis Date Noted  . Exposure to STD 09/29/2016    Past Surgical History:  Procedure Laterality Date  . KNEE ARTHROSCOPY         No family history on file.  Social History   Tobacco Use  . Smoking status: Never Smoker  . Smokeless tobacco: Never Used  Vaping Use  . Vaping Use: Never used  Substance Use Topics  . Alcohol use: Yes    Comment: occ  . Drug use: Yes    Types: Marijuana    Home Medications Prior to Admission medications   Medication Sig Start Date End Date Taking? Authorizing Provider  atorvastatin (LIPITOR) 20 MG tablet Take 1 tablet (20 mg total) by mouth daily. 09/08/19   Hoy Register, MD  glipiZIDE (GLUCOTROL) 10 MG tablet Take 0.5 tablets (5 mg total) by mouth 2 (two) times daily before a meal. 09/08/19   Newlin, Enobong, MD  Insulin Pen Needle 31G X 5 MM MISC 1 each by Does not apply route at bedtime. 09/08/19   Hoy Register, MD  lisinopril (ZESTRIL) 5 MG tablet Take 1 tablet (5 mg total) by mouth daily. 07/21/20 08/20/20  Jeannie Fend, PA-C  metFORMIN (GLUCOPHAGE) 1000 MG tablet Take 1 tablet (1,000 mg total) by mouth 2 (two) times daily. Patient not taking: Reported on 09/08/2019 08/04/19   Virgina Norfolk, DO  methocarbamol (ROBAXIN) 500 MG tablet Take 1 tablet (500 mg total) by mouth 2 (two) times daily.  02/25/20   Gailen Shelter, PA  PRESCRIPTION MEDICATION Take 1 tablet by mouth daily. Cholesterol    [provider]  Semaglutide, 1 MG/DOSE, (OZEMPIC, 1 MG/DOSE,) 2 MG/1.5ML SOPN Inject 1 mg into the skin once a week. 07/21/20   Jeannie Fend, PA-C  sitaGLIPtin-metformin (JANUMET) 50-1000 MG tablet Take 1 tablet by mouth 2 (two) times daily with a meal.    [provider]    Allergies    Patient has no known allergies.  Review of Systems   Review of Systems  Constitutional: Negative for fever.  Eyes: Negative for visual disturbance.  Respiratory: Negative for shortness of breath.   Cardiovascular: Negative for chest pain and leg swelling.  Gastrointestinal: Negative for abdominal pain, constipation, diarrhea, nausea and vomiting.  Endocrine: Negative for polydipsia, polyphagia and polyuria.  Allergic/Immunologic: Positive for immunocompromised state.  Neurological: Negative for dizziness, weakness, numbness and headaches.  All other systems reviewed and are negative.   Physical Exam Updated Vital Signs BP (!) 136/98   Pulse 86   Temp 98.9 F (37.2 C) (Oral)   Resp 17   SpO2 98%   Physical Exam Vitals and nursing note reviewed.  Constitutional:      General: He is not in acute distress.  Appearance: He is well-developed. He is not diaphoretic.  HENT:     Head: Normocephalic and atraumatic.  Cardiovascular:     Rate and Rhythm: Normal rate and regular rhythm.     Pulses: Normal pulses.     Heart sounds: Normal heart sounds.  Pulmonary:     Effort: Pulmonary effort is normal.     Breath sounds: Normal breath sounds.  Abdominal:     Palpations: Abdomen is soft.     Tenderness: There is no abdominal tenderness.  Musculoskeletal:     Right lower leg: No edema.     Left lower leg: No edema.  Skin:    General: Skin is warm and dry.     Findings: No erythema or rash.  Neurological:     Mental Status: He is alert and oriented to person, place, and  time.  Psychiatric:        Behavior: Behavior normal.     ED Results / Procedures / Treatments   Labs (all labs ordered are listed, but only abnormal results are displayed) Labs Reviewed - No data to display  EKG None  Radiology No results found.  Procedures Procedures (including critical care time)  Medications Ordered in ED Medications - No data to display  ED Course  I have reviewed the triage vital signs and the nursing notes.  Pertinent labs & imaging results that were available during my care of the patient were reviewed by me and considered in my medical decision making (see chart for details).  Clinical Course as of Jul 22 1755  Wed Jul 21, 2020  1755 49yo male with request for refill of his medications without acute complaint today. Refill given with understanding he will keep his appointment and call for an earlier appointment. Further refills will not be available from the ER.   [LM]    Clinical Course User Index [LM] Alden Hipp   MDM Rules/Calculators/A&P                          Final Clinical Impression(s) / ED Diagnoses Final diagnoses:  Medication refill    Rx / DC Orders ED Discharge Orders         Ordered    lisinopril (ZESTRIL) 5 MG tablet  Daily        07/21/20 1653    Semaglutide, 1 MG/DOSE, (OZEMPIC, 1 MG/DOSE,) 2 MG/1.5ML SOPN  Weekly       Note to Pharmacy: Dispense 1 pen   07/21/20 1653           Jeannie Fend, PA-C 07/21/20 1756    Rolan Bucco, MD 07/21/20 2235

## 2020-08-03 ENCOUNTER — Telehealth (HOSPITAL_BASED_OUTPATIENT_CLINIC_OR_DEPARTMENT_OTHER): Payer: Self-pay | Admitting: Emergency Medicine

## 2020-08-18 ENCOUNTER — Telehealth: Payer: Self-pay | Admitting: Pharmacist

## 2020-08-18 ENCOUNTER — Ambulatory Visit: Payer: BLUE CROSS/BLUE SHIELD | Attending: Family Medicine | Admitting: Family Medicine

## 2020-08-18 ENCOUNTER — Other Ambulatory Visit: Payer: Self-pay

## 2020-08-18 ENCOUNTER — Encounter: Payer: Self-pay | Admitting: Family Medicine

## 2020-08-18 ENCOUNTER — Other Ambulatory Visit: Payer: Self-pay | Admitting: Pharmacist

## 2020-08-18 VITALS — BP 129/81 | HR 89 | Ht 71.0 in | Wt 278.0 lb

## 2020-08-18 DIAGNOSIS — I10 Essential (primary) hypertension: Secondary | ICD-10-CM

## 2020-08-18 DIAGNOSIS — E1165 Type 2 diabetes mellitus with hyperglycemia: Secondary | ICD-10-CM | POA: Diagnosis not present

## 2020-08-18 DIAGNOSIS — L918 Other hypertrophic disorders of the skin: Secondary | ICD-10-CM

## 2020-08-18 DIAGNOSIS — G4733 Obstructive sleep apnea (adult) (pediatric): Secondary | ICD-10-CM

## 2020-08-18 DIAGNOSIS — Z1159 Encounter for screening for other viral diseases: Secondary | ICD-10-CM

## 2020-08-18 LAB — GLUCOSE, POCT (MANUAL RESULT ENTRY): POC Glucose: 225 mg/dl — AB (ref 70–99)

## 2020-08-18 LAB — POCT GLYCOSYLATED HEMOGLOBIN (HGB A1C): HbA1c, POC (controlled diabetic range): 11 % — AB (ref 0.0–7.0)

## 2020-08-18 MED ORDER — LISINOPRIL 5 MG PO TABS: 5.0000 mg | ORAL_TABLET | Freq: Every day | ORAL | 1 refills | Status: AC

## 2020-08-18 MED ORDER — OZEMPIC (1 MG/DOSE) 2 MG/1.5ML ~~LOC~~ SOPN: 1.0000 mg | PEN_INJECTOR | SUBCUTANEOUS | 6 refills | Status: DC

## 2020-08-18 MED ORDER — GLIPIZIDE 10 MG PO TABS: 10.0000 mg | ORAL_TABLET | Freq: Two times a day (BID) | ORAL | 1 refills | Status: DC

## 2020-08-18 MED ORDER — ATORVASTATIN CALCIUM 20 MG PO TABS: 20.0000 mg | ORAL_TABLET | Freq: Every day | ORAL | 1 refills | Status: DC

## 2020-08-18 NOTE — Telephone Encounter (Signed)
Dr. Alvis Lemmings is wanting this patient to begin Ozempic. Can you start a PA for him?

## 2020-08-18 NOTE — Progress Notes (Signed)
Needs refills on medications. 

## 2020-08-18 NOTE — Patient Instructions (Signed)

## 2020-08-18 NOTE — Progress Notes (Signed)
Subjective:  Patient ID: Vernon Floyd, male    DOB: [birth year]  Age: 49 y.o. MRN: [id number]  CC: Diabetes   HPI [name] is a 49 year old male with history of type 2 diabetes mellitus, hypertension, hyperlipidemia, obstructive sleep apnea last seen in the clinic 11 months ago who presents today for chronic disease management. He has been without medications for 6 months and the Ozempic he was told would not be covered by his insurance. His sugars have been elevated to the 300s  Complains Metformin makes him sick come off it. He does have neuropathy in his hands and feet but declines taking medications as he states it is not bothersome.  He has a CPAP machine but does not use it as it irritates him and blows a hole in his throat whenever he uses it.  As a result he has been snoring at night and endorses daytime somnolence and fatigue.  He had a sleep study sometime ago. He has a skin tag on his right groin which he would like excised. Past Medical History:  Diagnosis Date  . Diabetes mellitus without complication ([place])   . Hypertension   . Obesity     Past Surgical History:  Procedure Laterality Date  . KNEE ARTHROSCOPY      History reviewed. No pertinent family history.  No Known Allergies  Outpatient Medications Prior to Visit  Medication Sig Dispense Refill  . Insulin Pen Needle 31G X 5 MM MISC 1 each by Does not apply route at bedtime. 30 each 5  . methocarbamol (ROBAXIN) 500 MG tablet Take 1 tablet (500 mg total) by mouth 2 (two) times daily. 20 tablet 0  . PRESCRIPTION MEDICATION Take 1 tablet by mouth daily. Cholesterol    . sitaGLIPtin-metformin (JANUMET) 50-1000 MG tablet Take 1 tablet by mouth 2 (two) times daily with a meal.    . atorvastatin (LIPITOR) 20 MG tablet Take 1 tablet (20 mg total) by mouth daily. 90 tablet 1  . glipiZIDE (GLUCOTROL) 10 MG tablet Take 0.5 tablets (5 mg total) by mouth 2 (two) times daily before a meal. 180 tablet 1  . lisinopril  (ZESTRIL) 5 MG tablet Take 1 tablet (5 mg total) by mouth daily. 30 tablet 0  . metFORMIN (GLUCOPHAGE) 1000 MG tablet Take 1 tablet (1,000 mg total) by mouth 2 (two) times daily. 30 tablet 2  . Semaglutide, 1 MG/DOSE, (OZEMPIC, 1 MG/DOSE,) 2 MG/1.5ML SOPN Inject 1 mg into the skin once a week. 1 mL 0   No facility-administered medications prior to visit.     ROS Review of Systems  Constitutional: Negative for activity change and appetite change.  HENT: Negative for sinus pressure and sore throat.   Respiratory: Negative for chest tightness, shortness of breath and wheezing.   Cardiovascular: Negative for chest pain and palpitations.  Gastrointestinal: Negative for abdominal distention, abdominal pain and constipation.  Genitourinary: Negative.   Musculoskeletal: Negative.   Neurological: Positive for numbness.  Psychiatric/Behavioral: Negative for behavioral problems and dysphoric mood.    Objective:  BP 129/81   Pulse 89   Ht 5' 11"  (1.803 m)   Wt 278 lb (126.1 kg)   SpO2 97%   BMI 38.77 kg/m   BP/Weight 08/18/2020 07/21/2020 0/25/4270  Systolic BP 623 762 831  Diastolic BP 81 98 91  Wt. (Lbs) 278 - 281  BMI 38.77 - 39.19      Physical Exam Constitutional:      Appearance: He is well-developed. He is obese.  Neck:     Vascular: No JVD.  Cardiovascular:     Rate and Rhythm: Normal rate.     Heart sounds: Normal heart sounds. No murmur heard.   Pulmonary:     Effort: Pulmonary effort is normal.     Breath sounds: Normal breath sounds. No wheezing or rales.  Chest:     Chest wall: No tenderness.  Abdominal:     General: Bowel sounds are normal. There is no distension.     Palpations: Abdomen is soft. There is no mass.     Tenderness: There is no abdominal tenderness.  Musculoskeletal:        General: Normal range of motion.     Right lower leg: No edema.     Left lower leg: No edema.  Skin:    Comments: R groin skin tag  Neurological:     Mental Status: He  is alert and oriented to person, place, and time.  Psychiatric:        Mood and Affect: Mood normal.     CMP Latest Ref Rng & Units 08/04/2019 10/13/2018 03/04/2018  Glucose 70 - 99 mg/dL 559(HH) 130(H) -  BUN 6 - 20 mg/dL 11 16 -  Creatinine 0.61 - 1.24 mg/dL 1.00 1.04 -  Sodium 135 - 145 mmol/L 129(L) 140 -  Potassium 3.5 - 5.1 mmol/L 4.5 3.6 -  Chloride 98 - 111 mmol/L 95(L) 108 -  CO2 22 - 32 mmol/L 24 22 -  Calcium 8.9 - 10.3 mg/dL 9.7 8.9 -  Total Protein 6.5 - 8.1 g/dL 8.6(H) 8.8(H) 8.4(H)  Total Bilirubin 0.3 - 1.2 mg/dL 0.5 0.4 0.5  Alkaline Phos 38 - 126 U/L 92 70 73  AST 15 - 41 U/L 23 42(H) 35  ALT 0 - 44 U/L 38 49(H) 49    Lipid Panel  No results found for: CHOL, TRIG, HDL, CHOLHDL, VLDL, LDLCALC, LDLDIRECT  CBC    Component Value Date/Time   WBC 7.5 08/04/2019 1715   RBC 5.41 08/04/2019 1715   HGB 16.1 08/04/2019 1715   HCT 48.9 08/04/2019 1715   PLT 279 08/04/2019 1715   MCV 90.4 08/04/2019 1715   MCH 29.8 08/04/2019 1715   MCHC 32.9 08/04/2019 1715   RDW 13.4 08/04/2019 1715   LYMPHSABS 1.9 08/04/2019 1715   MONOABS 0.5 08/04/2019 1715   EOSABS 0.1 08/04/2019 1715   BASOSABS 0.0 08/04/2019 1715    Lab Results  Component Value Date   HGBA1C 11.0 (A) 08/18/2020    Assessment & Plan:  1. Type 2 diabetes mellitus with hyperglycemia, without long-term current use of insulin (HCC) Uncontrolled with A1c of 11.0; goal is less than 7.0 Running out of meds medications could explain this Discontinued Metformin per request and increased dose of glipizide I will obtain prior authorization for Ozempic Counseled on Diabetic diet, my plate method, 323 minutes of moderate intensity exercise/week Blood sugar logs with fasting goals of 80-120 mg/dl, random of less than 180 and in the event of sugars less than 60 mg/dl or greater than 400 mg/dl encouraged to notify the clinic. Advised on the need for annual eye exams, annual foot exams, Pneumonia vaccine. - POCT  glucose (manual entry) - POCT glycosylated hemoglobin (Hb A1C) - glipiZIDE (GLUCOTROL) 10 MG tablet; Take 1 tablet (10 mg total) by mouth 2 (two) times daily before a meal.  Dispense: 180 tablet; Refill: 1 - CMP14+EGFR; Future - Lipid panel; Future - Microalbumin / creatinine urine ratio; Future - Semaglutide, 1  MG/DOSE, (OZEMPIC, 1 MG/DOSE,) 2 MG/1.5ML SOPN; Inject 1 mg into the skin once a week.  Dispense: 4 mL; Refill: 6 - atorvastatin (LIPITOR) 20 MG tablet; Take 1 tablet (20 mg total) by mouth daily.  Dispense: 90 tablet; Refill: 1  2. Skin tag He will be scheduled with Dr. Hulen Skains for excision  3. Essential hypertension Controlled Counseled on blood pressure goal of less than 130/80, low-sodium, DASH diet, medication compliance, 150 minutes of moderate intensity exercise per week. Discussed medication compliance, adverse effects. - lisinopril (ZESTRIL) 5 MG tablet; Take 1 tablet (5 mg total) by mouth daily.  Dispense: 90 tablet; Refill: 1  4. Need for hepatitis C screening test - HCV RNA quant rflx ultra or genotyp([facility]); Future    Meds ordered this encounter  Medications  . glipiZIDE (GLUCOTROL) 10 MG tablet    Sig: Take 1 tablet (10 mg total) by mouth 2 (two) times daily before a meal.    Dispense:  180 tablet    Refill:  1    Dose increase  . Semaglutide, 1 MG/DOSE, (OZEMPIC, 1 MG/DOSE,) 2 MG/1.5ML SOPN    Sig: Inject 1 mg into the skin once a week.    Dispense:  4 mL    Refill:  6    Dispense 4 pen  . lisinopril (ZESTRIL) 5 MG tablet    Sig: Take 1 tablet (5 mg total) by mouth daily.    Dispense:  90 tablet    Refill:  1  . atorvastatin (LIPITOR) 20 MG tablet    Sig: Take 1 tablet (20 mg total) by mouth daily.    Dispense:  90 tablet    Refill:  1    Follow-up: Return in about 3 months (around 11/18/2020) for chronic disease management; Dr Hulen Skains - skin tag removal.       Charlott Rakes, MD, FAAFP. [facility] [place], [place]   08/18/2020, 2:27 PM

## 2020-08-19 ENCOUNTER — Ambulatory Visit: Payer: BLUE CROSS/BLUE SHIELD | Attending: Family Medicine

## 2020-08-19 ENCOUNTER — Telehealth: Payer: Self-pay

## 2020-08-19 DIAGNOSIS — E1165 Type 2 diabetes mellitus with hyperglycemia: Secondary | ICD-10-CM

## 2020-08-19 DIAGNOSIS — Z1159 Encounter for screening for other viral diseases: Secondary | ICD-10-CM

## 2020-08-19 MED ORDER — OZEMPIC (1 MG/DOSE) 4 MG/3ML ~~LOC~~ SOPN: 1.0000 mg | PEN_INJECTOR | SUBCUTANEOUS | 6 refills | Status: DC

## 2020-08-19 NOTE — Telephone Encounter (Signed)
Medication needs to be changed to 4mg /6ml pen

## 2020-08-19 NOTE — Addendum Note (Signed)
Addended by: Hoy Register on: 08/19/2020 08:44 PM   Modules accepted: Orders

## 2020-08-19 NOTE — Telephone Encounter (Signed)
I have ordered it but looks like it did not go electronically. Can you please resend or print and fax? Thanks

## 2020-08-19 NOTE — Telephone Encounter (Signed)
Vernon Floyd with [facility] pharmacy calling. States Ozempic no longer comes in 2mg /1.83ml. Asking for 4mg /84ml pen. Reports 1 pen will be a months dose. Please advise.

## 2020-08-20 ENCOUNTER — Telehealth: Payer: Self-pay

## 2020-08-20 ENCOUNTER — Other Ambulatory Visit: Payer: Self-pay

## 2020-08-20 MED ORDER — OZEMPIC (1 MG/DOSE) 4 MG/3ML ~~LOC~~ SOPN: 1.0000 mg | PEN_INJECTOR | SUBCUTANEOUS | 6 refills | Status: DC

## 2020-08-20 NOTE — Telephone Encounter (Signed)
Medication has been sent electronically.  

## 2020-08-20 NOTE — Telephone Encounter (Signed)
-----   Message from Hoy Register, MD sent at 08/20/2020 11:51 AM EDT ----- Labs reveal elevated LDL (bad cholesterol) but I will make no regimen changes given he had been out of his atorvastatin.  Glucose level is also elevated due to also running out of medications but this was addressed during his visit.  Please encourage to comply with a low-cholesterol, diabetic diet and lifestyle modifications.

## 2020-08-20 NOTE — Telephone Encounter (Signed)
Patient name and DOB has been verified Patient was informed of lab results. Patient had no questions.  

## 2020-08-21 LAB — LIPID PANEL
Chol/HDL Ratio: 3.2 ratio (ref 0.0–5.0)
Cholesterol, Total: 185 mg/dL (ref 100–199)
HDL: 58 mg/dL (ref >39)
LDL Chol Calc (NIH): 111 mg/dL — ABNORMAL HIGH (ref 0–99)
Triglycerides: 90 mg/dL (ref 0–149)
VLDL Cholesterol Cal: 16 mg/dL (ref 5–40)

## 2020-08-21 LAB — CMP14+EGFR
ALT: 40 IU/L (ref 0–44)
AST: 22 IU/L (ref 0–40)
Albumin/Globulin Ratio: 1.1 — ABNORMAL LOW (ref 1.2–2.2)
Albumin: 4.1 g/dL (ref 4.0–5.0)
Alkaline Phosphatase: 95 IU/L (ref 44–121)
BUN/Creatinine Ratio: 15 (ref 9–20)
BUN: 15 mg/dL (ref 6–24)
Bilirubin Total: 0.3 mg/dL (ref 0.0–1.2)
CO2: 24 mmol/L (ref 20–29)
Calcium: 9.6 mg/dL (ref 8.7–10.2)
Chloride: 99 mmol/L (ref 96–106)
Creatinine, Ser: 0.99 mg/dL (ref 0.76–1.27)
GFR calc Af Amer: 104 mL/min/{1.73_m2} (ref >59)
GFR calc non Af Amer: 90 mL/min/{1.73_m2} (ref >59)
Globulin, Total: 3.7 g/dL (ref 1.5–4.5)
Glucose: 274 mg/dL — ABNORMAL HIGH (ref 65–99)
Potassium: 4.7 mmol/L (ref 3.5–5.2)
Sodium: 136 mmol/L (ref 134–144)
Total Protein: 7.8 g/dL (ref 6.0–8.5)

## 2020-08-21 LAB — HCV RNA QUANT RFLX ULTRA OR GENOTYP: HCV Quant Baseline: NOT DETECTED IU/mL

## 2020-08-21 LAB — MICROALBUMIN / CREATININE URINE RATIO
Creatinine, Urine: 250.1 mg/dL
Microalb/Creat Ratio: 31 mg/g creat — ABNORMAL HIGH (ref 0–29)
Microalbumin, Urine: 76.7 ug/mL

## 2020-08-23 ENCOUNTER — Telehealth: Payer: Self-pay

## 2020-08-23 NOTE — Telephone Encounter (Signed)
Patient was called and a voicemail was left informing patient to return phone call for lab results. 

## 2020-08-23 NOTE — Telephone Encounter (Signed)
-----   Message from Marcine Matar, MD sent at 08/21/2020 10:19 AM EDT ----- Screening test for hep C is negative.

## 2020-08-25 NOTE — Telephone Encounter (Signed)
PA not needed at this time

## 2020-09-07 ENCOUNTER — Ambulatory Visit: Payer: BLUE CROSS/BLUE SHIELD | Admitting: General Surgery

## 2020-10-12 ENCOUNTER — Ambulatory Visit: Payer: BLUE CROSS/BLUE SHIELD | Admitting: General Surgery

## 2020-11-18 ENCOUNTER — Ambulatory Visit: Payer: BLUE CROSS/BLUE SHIELD | Admitting: Family Medicine

## 2020-11-23 ENCOUNTER — Institutional Professional Consult (permissible substitution): Payer: BLUE CROSS/BLUE SHIELD | Admitting: Pulmonary Disease

## 2020-11-25 ENCOUNTER — Ambulatory Visit: Payer: BLUE CROSS/BLUE SHIELD | Admitting: General Surgery

## 2021-03-17 ENCOUNTER — Encounter (HOSPITAL_BASED_OUTPATIENT_CLINIC_OR_DEPARTMENT_OTHER): Payer: Self-pay | Admitting: Emergency Medicine

## 2021-03-17 ENCOUNTER — Emergency Department (HOSPITAL_BASED_OUTPATIENT_CLINIC_OR_DEPARTMENT_OTHER)
Admission: EM | Admit: 2021-03-17 | Discharge: 2021-03-17 | Disposition: A | Discharge status: Home / Self Care | Payer: BLUE CROSS/BLUE SHIELD | Attending: Emergency Medicine | Admitting: Emergency Medicine

## 2021-03-17 ENCOUNTER — Other Ambulatory Visit: Payer: Self-pay

## 2021-03-17 DIAGNOSIS — Z79899 Other long term (current) drug therapy: Secondary | ICD-10-CM | POA: Diagnosis not present

## 2021-03-17 DIAGNOSIS — Z7984 Long term (current) use of oral hypoglycemic drugs: Secondary | ICD-10-CM | POA: Insufficient documentation

## 2021-03-17 DIAGNOSIS — I10 Essential (primary) hypertension: Secondary | ICD-10-CM | POA: Insufficient documentation

## 2021-03-17 DIAGNOSIS — Z202 Contact with and (suspected) exposure to infections with a predominantly sexual mode of transmission: Secondary | ICD-10-CM | POA: Insufficient documentation

## 2021-03-17 DIAGNOSIS — E119 Type 2 diabetes mellitus without complications: Secondary | ICD-10-CM | POA: Diagnosis not present

## 2021-03-17 LAB — URINALYSIS, ROUTINE W REFLEX MICROSCOPIC
Bilirubin Urine: NEGATIVE
Glucose, UA: 250 mg/dL — AB
Ketones, ur: NEGATIVE mg/dL
Nitrite: NEGATIVE
Protein, ur: NEGATIVE mg/dL
Specific Gravity, Urine: 1.025 (ref 1.005–1.030)
pH: 6 (ref 5.0–8.0)

## 2021-03-17 LAB — URINALYSIS, MICROSCOPIC (REFLEX)

## 2021-03-17 LAB — HIV ANTIBODY (ROUTINE TESTING W REFLEX): HIV Screen 4th Generation wRfx: NONREACTIVE

## 2021-03-17 MED ORDER — CEFTRIAXONE SODIUM 500 MG IJ SOLR
500.0000 mg | Freq: Once | INTRAMUSCULAR | Status: AC
  Administered 2021-03-17: 500 mg via INTRAMUSCULAR
  Filled 2021-03-17: qty 500

## 2021-03-17 MED ORDER — DOXYCYCLINE HYCLATE 100 MG PO CAPS: 100.0000 mg | ORAL_CAPSULE | Freq: Two times a day (BID) | ORAL | 0 refills | Status: DC

## 2021-03-17 NOTE — ED Provider Notes (Signed)
Emergency Medicine Provider Triage Evaluation Note  Vernon Floyd , a 50 y.o. male  was evaluated in triage.  Pt complains of exposure to gonhorrea.  He was notified that his partner tested positive for gonorrhea.  He is asymptomatic   Review of Systems  Positive: none Negative: none  Physical Exam  BP (!) 156/94 (BP Location: Left Arm)   Pulse 96   Temp 98.6 F (37 C) (Oral)   Resp 18   Ht 5\' 11"  (1.803 m)   Wt 122.5 kg   SpO2 98%   BMI 37.66 kg/m  Gen:   Awake, no distress   Resp:  Normal effort  MSK:   Moves extremities without difficulty    Medical Decision Making  Medically screening exam initiated at 1:30 PM.  Appropriate orders placed.  Vernon Floyd was informed that the remainder of the evaluation will be completed by another provider, this initial triage assessment does not replace that evaluation, and the importance of remaining in the ED until their evaluation is complete.     Alphonsa Gin, MD 03/17/21 1334

## 2021-03-17 NOTE — ED Provider Notes (Signed)
[facility] EMERGENCY DEPARTMENT Provider Note   CSN: [id number] Arrival date & time: 03/17/21  1057     History Chief Complaint  Patient presents with  . Exposure to STD    Vernon Floyd is a 50 y.o. male w PMHx DM, HTN, presenting for evaluation of exposure to STD.  Patient states he was sexually active with 1 male partner without protection.  He was just informed today that she tested positive for gonorrhea in her throat.  He states he is completely asymptomatic.  Denies penile discharge or pain, dysuria, rashes or lesions, testicular pain or swelling, abdominal pain, pain with defecation, fevers.  No history of STDs  The history is provided by the patient.       Past Medical History:  Diagnosis Date  . Diabetes mellitus without complication (HCC)   . Hypertension   . Obesity     Patient Active Problem List   Diagnosis Date Noted  . Exposure to STD 09/29/2016    Past Surgical History:  Procedure Laterality Date  . KNEE ARTHROSCOPY         No family history on file.  Social History   Tobacco Use  . Smoking status: Never Smoker  . Smokeless tobacco: Never Used  Vaping Use  . Vaping Use: Never used  Substance Use Topics  . Alcohol use: Yes    Comment: occ  . Drug use: Yes    Types: Marijuana    Home Medications Prior to Admission medications   Medication Sig Start Date End Date Taking? Authorizing Provider  doxycycline (VIBRAMYCIN) 100 MG capsule Take 1 capsule (100 mg total) by mouth 2 (two) times daily. 03/17/21  Yes Jamilex Bohnsack, [place] N, PA-C  atorvastatin (LIPITOR) 20 MG tablet Take 1 tablet (20 mg total) by mouth daily. 08/18/20   Hoy Register, MD  glipiZIDE (GLUCOTROL) 10 MG tablet Take 1 tablet (10 mg total) by mouth 2 (two) times daily before a meal. 08/18/20   Hoy Register, MD  Insulin Pen Needle 31G X 5 MM MISC 1 each by Does not apply route at bedtime. 09/08/19   Hoy Register, MD  lisinopril (ZESTRIL) 5 MG tablet Take 1  tablet (5 mg total) by mouth daily. 08/18/20 09/17/20  Hoy Register, MD  methocarbamol (ROBAXIN) 500 MG tablet Take 1 tablet (500 mg total) by mouth 2 (two) times daily. 02/25/20   Gailen Shelter, PA  PRESCRIPTION MEDICATION Take 1 tablet by mouth daily. Cholesterol    [provider]  Semaglutide, 1 MG/DOSE, (OZEMPIC, 1 MG/DOSE,) 4 MG/3ML SOPN Inject 1 mg into the skin once a week. 08/20/20   Hoy Register, MD  sitaGLIPtin-metformin (JANUMET) 50-1000 MG tablet Take 1 tablet by mouth 2 (two) times daily with a meal.    [provider]    Allergies    Patient has no known allergies.  Review of Systems   Review of Systems  All other systems reviewed and are negative.   Physical Exam Updated Vital Signs BP (!) 156/94 (BP Location: Left Arm)   Pulse 96   Temp 98.6 F (37 C) (Oral)   Resp 18   Ht 5\' 11"  (1.803 m)   Wt 122.5 kg   SpO2 98%   BMI 37.66 kg/m   Physical Exam Vitals and nursing note reviewed.  Constitutional:      General: He is not in acute distress.    Appearance: He is well-developed.  HENT:     Head: Normocephalic and atraumatic.  Eyes:  Conjunctiva/sclera: Conjunctivae normal.  Cardiovascular:     Rate and Rhythm: Normal rate.  Pulmonary:     Effort: Pulmonary effort is normal.  Genitourinary:    Comments: Exam performed with male RN chaperone present.  Penis is circumcised.  No rashes or lesions.  No tenderness.  No obvious penile discharge.  No testicular or scrotal swelling or tenderness. Neurological:     Mental Status: He is alert.  Psychiatric:        Mood and Affect: Mood normal.        Behavior: Behavior normal.     ED Results / Procedures / Treatments   Labs (all labs ordered are listed, but only abnormal results are displayed) Labs Reviewed  URINALYSIS, ROUTINE W REFLEX MICROSCOPIC - Abnormal; Notable for the following components:      Result Value   APPearance HAZY (*)    Glucose, UA 250 (*)    Hgb urine  dipstick TRACE (*)    Leukocytes,Ua SMALL (*)    All other components within normal limits  URINALYSIS, MICROSCOPIC (REFLEX) - Abnormal; Notable for the following components:   Bacteria, UA FEW (*)    All other components within normal limits  RPR  HIV ANTIBODY (ROUTINE TESTING W REFLEX)  GC/CHLAMYDIA PROBE AMP () NOT AT [facility]    EKG None  Radiology No results found.  Procedures Procedures   Medications Ordered in ED Medications  cefTRIAXone (ROCEPHIN) injection 500 mg (500 mg Intramuscular Given 03/17/21 1422)    ED Course  I have reviewed the triage vital signs and the nursing notes.  Pertinent labs & imaging results that were available during my care of the patient were reviewed by me and considered in my medical decision making (see chart for details).    MDM Rules/Calculators/A&P                          Patient is presenting for exposure to gonorrhea cleared.  He is completely asymptomatic.  GU exam is unremarkable.   STD cultures obtained including HIV, syphilis, gonorrhea and chlamydia. Patient to be discharged with instructions toollow up with PCP. Discussed importance of using protection when sexually active. Pt understands that they have GC/Chlamydia cultures pending and that they will need to inform all sexual partners if results return positive. Patient has been treated prophylactically with doxycycline and Rocephin.  PCP follow-up.  Discussed results, findings, treatment and follow up. Patient advised of return precautions. Patient verbalized understanding and agreed with plan.   Final Clinical Impression(s) / ED Diagnoses Final diagnoses:  Exposure to gonorrhea    Rx / DC Orders ED Discharge Orders         Ordered    doxycycline (VIBRAMYCIN) 100 MG capsule  2 times daily        03/17/21 1500           Teisha Trowbridge, [place] N, [place] 03/17/21 1500    Tilden Fossa, MD 03/17/21 [phone]

## 2021-03-17 NOTE — ED Notes (Signed)
Allergy status confirmed prior to med administration

## 2021-03-17 NOTE — ED Notes (Signed)
ED Provider at bedside. To discuss labs and tx

## 2021-03-17 NOTE — Discharge Instructions (Addendum)
Please read the instructions below.  Talk with your primary care provider about any new medications.  You have begun treatment today for gonorrhea and chlamydia.  It is important you finish the antibiotic, doxycycline, to complete your treatment.  Take this once every 12 hours until gone.  It is important that you protect your skin from the sun if you plan for any prolonged sun exposure while taking this medication. A side effect of this medication includes increasing your skin sensitivity to UV rays and can result in rash. You will receive a call from the hospital if your test results come back positive. Avoid sexual activity until you know your test results. If your results come back positive, it is important that you inform all of your sexual partners.  Return to the ER for new pain with bowel movements, abdominal pain, fever, swelling/pain in your testicles, or new or worsening symptoms.

## 2021-03-17 NOTE — ED Triage Notes (Signed)
Pt reports getting phone call today that sexual partner tested positive for gonorrhea

## 2021-03-17 NOTE — ED Notes (Signed)
Urine spec obtained for further lab test as ordered by ED PA

## 2021-03-18 LAB — GC/CHLAMYDIA PROBE AMP (~~LOC~~) NOT AT ARMC
Chlamydia: POSITIVE — AB
Comment: NEGATIVE
Comment: NORMAL
Neisseria Gonorrhea: POSITIVE — AB

## 2021-03-18 LAB — RPR: RPR Ser Ql: NONREACTIVE

## 2021-04-06 ENCOUNTER — Telehealth: Payer: Self-pay | Admitting: Family Medicine

## 2021-04-06 NOTE — Telephone Encounter (Signed)
Patient will be mailed a recent medication list.

## 2021-04-06 NOTE — Telephone Encounter (Addendum)
Pt has called back and states that he wanted Dr Alvis Lemmings or her nurse to call him back. He wants to make sure that he understand his medications and what changes are necessary in re to his recent diagnosis at ED due to the results of his labs.  States that he needs more than a list mailed to him, seems worried about what his results mean in re to his diabetes.[phone]

## 2021-04-06 NOTE — Telephone Encounter (Unsigned)
Copied from CRM [id number]. Topic: General - Other >> Apr 04, 2021  1:56 PM Pawlus, Maxine Glenn A wrote: Reason for CRM: Pt requested a call back from Dr Alvis Lemmings or her nurse to go over his medication list. Please advise.

## 2021-04-07 NOTE — Telephone Encounter (Signed)
Pt was informed at ED visit that he would need to retest in 2 weeks to see if medication is still needed. Pt can be placed on nurse schedule for testing. Orders needs to be placed.

## 2021-04-07 NOTE — Telephone Encounter (Signed)
Noted  

## 2021-04-07 NOTE — Telephone Encounter (Signed)
Please order ancillary test at his Nurse visit as there is no option for future lab on order. Thanks

## 2021-04-08 ENCOUNTER — Ambulatory Visit: Payer: BLUE CROSS/BLUE SHIELD | Attending: Family Medicine

## 2021-04-08 ENCOUNTER — Other Ambulatory Visit: Payer: Self-pay

## 2021-04-08 DIAGNOSIS — Z113 Encounter for screening for infections with a predominantly sexual mode of transmission: Secondary | ICD-10-CM

## 2021-04-08 NOTE — Progress Notes (Signed)
Pt dropped of urine sample to clinic  Pt was informed that he will be called once results come back.

## 2021-04-11 LAB — URINE CYTOLOGY ANCILLARY ONLY
Bacterial Vaginitis-Urine: NEGATIVE
Candida Urine: NEGATIVE
Chlamydia: NEGATIVE
Comment: NEGATIVE
Comment: NEGATIVE
Comment: NORMAL
Neisseria Gonorrhea: NEGATIVE
Trichomonas: NEGATIVE

## 2021-05-11 ENCOUNTER — Ambulatory Visit: Payer: BLUE CROSS/BLUE SHIELD | Admitting: Physician Assistant

## 2021-05-19 ENCOUNTER — Other Ambulatory Visit: Payer: Self-pay

## 2021-05-19 ENCOUNTER — Emergency Department (HOSPITAL_BASED_OUTPATIENT_CLINIC_OR_DEPARTMENT_OTHER)
Admission: EM | Admit: 2021-05-19 | Discharge: 2021-05-19 | Disposition: A | Discharge status: Home / Self Care | Payer: BLUE CROSS/BLUE SHIELD | Attending: Emergency Medicine | Admitting: Emergency Medicine

## 2021-05-19 ENCOUNTER — Encounter (HOSPITAL_BASED_OUTPATIENT_CLINIC_OR_DEPARTMENT_OTHER): Payer: Self-pay | Admitting: *Deleted

## 2021-05-19 DIAGNOSIS — E1165 Type 2 diabetes mellitus with hyperglycemia: Secondary | ICD-10-CM | POA: Insufficient documentation

## 2021-05-19 DIAGNOSIS — Z794 Long term (current) use of insulin: Secondary | ICD-10-CM | POA: Diagnosis not present

## 2021-05-19 DIAGNOSIS — L0501 Pilonidal cyst with abscess: Secondary | ICD-10-CM | POA: Insufficient documentation

## 2021-05-19 DIAGNOSIS — I10 Essential (primary) hypertension: Secondary | ICD-10-CM | POA: Diagnosis not present

## 2021-05-19 DIAGNOSIS — Z7984 Long term (current) use of oral hypoglycemic drugs: Secondary | ICD-10-CM | POA: Insufficient documentation

## 2021-05-19 DIAGNOSIS — Z79899 Other long term (current) drug therapy: Secondary | ICD-10-CM | POA: Insufficient documentation

## 2021-05-19 DIAGNOSIS — L0232 Furuncle of buttock: Secondary | ICD-10-CM | POA: Diagnosis present

## 2021-05-19 LAB — BASIC METABOLIC PANEL
Anion gap: 10 (ref 5–15)
BUN: 13 mg/dL (ref 6–20)
CO2: 23 mmol/L (ref 22–32)
Calcium: 8.9 mg/dL (ref 8.9–10.3)
Chloride: 97 mmol/L — ABNORMAL LOW (ref 98–111)
Creatinine, Ser: 1.1 mg/dL (ref 0.61–1.24)
GFR, Estimated: >60 mL/min (ref >60)
Glucose, Bld: 413 mg/dL — ABNORMAL HIGH (ref 70–99)
Potassium: 4.1 mmol/L (ref 3.5–5.1)
Sodium: 130 mmol/L — ABNORMAL LOW (ref 135–145)

## 2021-05-19 LAB — CBC
HCT: 50.2 % (ref 39.0–52.0)
Hemoglobin: 17.1 g/dL — ABNORMAL HIGH (ref 13.0–17.0)
MCH: 30.9 pg (ref 26.0–34.0)
MCHC: 34.1 g/dL (ref 30.0–36.0)
MCV: 90.8 fL (ref 80.0–100.0)
Platelets: 293 10*3/uL (ref 150–400)
RBC: 5.53 MIL/uL (ref 4.22–5.81)
RDW: 13.6 % (ref 11.5–15.5)
WBC: 12.1 10*3/uL — ABNORMAL HIGH (ref 4.0–10.5)
nRBC: 0 % (ref 0.0–0.2)

## 2021-05-19 LAB — CBG MONITORING, ED
Glucose-Capillary: 443 mg/dL — ABNORMAL HIGH (ref 70–99)
Glucose-Capillary: 345 mg/dL — ABNORMAL HIGH (ref 70–99)

## 2021-05-19 MED ORDER — LIDOCAINE-EPINEPHRINE (PF) 2 %-1:200000 IJ SOLN
10.0000 mL | Freq: Once | INTRAMUSCULAR | Status: AC
  Administered 2021-05-19: 10 mL
  Filled 2021-05-19: qty 20

## 2021-05-19 MED ORDER — SODIUM CHLORIDE 0.9 % IV BOLUS
1000.0000 mL | Freq: Once | INTRAVENOUS | Status: AC
  Administered 2021-05-19: 1000 mL via INTRAVENOUS

## 2021-05-19 MED ORDER — CLINDAMYCIN HCL 300 MG PO CAPS: 300.0000 mg | ORAL_CAPSULE | Freq: Three times a day (TID) | ORAL | 0 refills | Status: AC

## 2021-05-19 NOTE — Discharge Instructions (Addendum)
Packing removal in 2 days. This can be done at urgent care, in the ER or at your doctor's office. Referral to general surgery as discussed. Take your antibiotics as prescribed. Monitor your blood sugar, take your medications as prescribed.

## 2021-05-19 NOTE — ED Provider Notes (Signed)
[facility] EMERGENCY DEPARTMENT Provider Note   CSN: [id number] Arrival date & time: 05/19/21  1310     History Chief Complaint  Patient presents with   Abscess    Vernon Floyd is a 50 y.o. male.  50 year old male with history of diabetes poorly controlled with Ozempic presents with complaint of urinary frequency with concern for boil to his sacrum.  Patient states he noticed 4 days ago that he was voiding more frequently which typically happens when his blood sugar is high.  Also noticed about the same time a painful bump to his mid sacrum area, no history of abscess previously. Patient tried taking his ozempic as prescribed for his diabetes but felt this made him nauseous. Denies fevers, chills, changes in bowel habits. No other complaints or concerns.       Past Medical History:  Diagnosis Date   Diabetes mellitus without complication (HCC)    Hypertension    Obesity     Patient Active Problem List   Diagnosis Date Noted   Exposure to STD 09/29/2016    Past Surgical History:  Procedure Laterality Date   KNEE ARTHROSCOPY         No family history on file.  Social History   Tobacco Use   Smoking status: Never   Smokeless tobacco: Never  Vaping Use   Vaping Use: Never used  Substance Use Topics   Alcohol use: Yes    Comment: occ   Drug use: Not Currently    Types: Marijuana    Home Medications Prior to Admission medications   Medication Sig Start Date End Date Taking? Authorizing Provider  clindamycin (CLEOCIN) 300 MG capsule Take 1 capsule (300 mg total) by mouth 3 (three) times daily for 10 days. 05/19/21 05/29/21 Yes Jeannie Fend, PA-C  Semaglutide, 1 MG/DOSE, (OZEMPIC, 1 MG/DOSE,) 4 MG/3ML SOPN Inject 1 mg into the skin once a week. 08/20/20  Yes Hoy Register, MD  atorvastatin (LIPITOR) 20 MG tablet Take 1 tablet (20 mg total) by mouth daily. 08/18/20   Hoy Register, MD  doxycycline (VIBRAMYCIN) 100 MG capsule Take 1 capsule (100  mg total) by mouth 2 (two) times daily. 03/17/21   Robinson, [place] N, PA-C  glipiZIDE (GLUCOTROL) 10 MG tablet Take 1 tablet (10 mg total) by mouth 2 (two) times daily before a meal. 08/18/20   Hoy Register, MD  Insulin Pen Needle 31G X 5 MM MISC 1 each by Does not apply route at bedtime. 09/08/19   Hoy Register, MD  lisinopril (ZESTRIL) 5 MG tablet Take 1 tablet (5 mg total) by mouth daily. 08/18/20 09/17/20  Hoy Register, MD  methocarbamol (ROBAXIN) 500 MG tablet Take 1 tablet (500 mg total) by mouth 2 (two) times daily. 02/25/20   Gailen Shelter, PA  PRESCRIPTION MEDICATION Take 1 tablet by mouth daily. Cholesterol    [provider]  sitaGLIPtin-metformin (JANUMET) 50-1000 MG tablet Take 1 tablet by mouth 2 (two) times daily with a meal.    [provider]    Allergies    Patient has no known allergies.  Review of Systems   Review of Systems  Constitutional:  Negative for chills, diaphoresis and fever.  Respiratory:  Negative for shortness of breath.   Cardiovascular:  Negative for chest pain.  Gastrointestinal:  Negative for abdominal pain, nausea and vomiting.  Endocrine: Positive for polyuria.  Genitourinary:  Positive for frequency. Negative for dysuria.  Musculoskeletal:  Negative for arthralgias and myalgias.  Skin:  Negative  for rash and wound.  Allergic/Immunologic: Positive for immunocompromised state.  Neurological:  Negative for weakness.  Psychiatric/Behavioral:  Negative for confusion.   All other systems reviewed and are negative.  Physical Exam Updated Vital Signs BP (!) 128/96 (BP Location: Left Arm)   Pulse (!) 114   Temp 99.6 F (37.6 C) (Oral)   Resp 18   Ht 5\' 11"  (1.803 m)   Wt 127 kg   SpO2 96%   BMI 39.05 kg/m   Physical Exam Vitals and nursing note reviewed.  Constitutional:      General: He is not in acute distress.    Appearance: He is well-developed. He is not diaphoretic.  HENT:     Head: Normocephalic and  atraumatic.  Cardiovascular:     Rate and Rhythm: Normal rate and regular rhythm.     Heart sounds: Normal heart sounds.  Pulmonary:     Effort: Pulmonary effort is normal.     Breath sounds: Normal breath sounds.  Abdominal:     Palpations: Abdomen is soft.     Tenderness: There is no abdominal tenderness.  Musculoskeletal:       Back:  Skin:    General: Skin is warm and dry.  Neurological:     Mental Status: He is alert and oriented to person, place, and time.  Psychiatric:        Behavior: Behavior normal.    ED Results / Procedures / Treatments   Labs (all labs ordered are listed, but only abnormal results are displayed) Labs Reviewed  CBC - Abnormal; Notable for the following components:      Result Value   WBC 12.1 (*)    Hemoglobin 17.1 (*)    All other components within normal limits  BASIC METABOLIC PANEL - Abnormal; Notable for the following components:   Sodium 130 (*)    Chloride 97 (*)    Glucose, Bld 413 (*)    All other components within normal limits  CBG MONITORING, ED - Abnormal; Notable for the following components:   Glucose-Capillary 443 (*)    All other components within normal limits  CBG MONITORING, ED - Abnormal; Notable for the following components:   Glucose-Capillary 345 (*)    All other components within normal limits    EKG None  Radiology No results found.  Procedures . Incision and Drainage  Date/Time: 05/19/2021 3:07 PM Performed by: 05/21/2021, PA-C Authorized by: Jeannie Fend, PA-C   Consent:    Consent obtained:  Verbal   Consent given by:  Patient   Risks discussed:  Bleeding, incomplete drainage, pain and damage to other organs   Alternatives discussed:  No treatment Universal protocol:    Procedure explained and questions answered to patient or proxy's satisfaction: yes     Relevant documents present and verified: yes     Test results available : yes     Imaging studies available: yes     Required blood  products, implants, devices, and special equipment available: yes     Site/side marked: yes     Immediately prior to procedure, a time out was called: yes     Patient identity confirmed:  Verbally with patient Location:    Type:  Pilonidal cyst   Size:  3cm x 2cm   Location:  Trunk   Trunk location: pilonidal abscess. Pre-procedure details:    Skin preparation:  Betadine Anesthesia:    Anesthesia method:  Local infiltration   Local anesthetic:  Lidocaine 1%  WITH epi Procedure type:    Complexity:  Complex Procedure details:    Incision types:  Single straight   Incision depth:  Subcutaneous   Wound management:  Probed and deloculated, irrigated with saline and extensive cleaning   Drainage:  Purulent   Drainage amount:  Moderate   Packing materials:  1/4 in gauze   Amount 1/4":  3" Post-procedure details:    Procedure completion:  Tolerated well, no immediate complications   Medications Ordered in ED Medications  sodium chloride 0.9 % bolus 1,000 mL (1,000 mLs Intravenous New Bag/Given 05/19/21 1423)  lidocaine-EPINEPHrine (XYLOCAINE W/EPI) 2 %-1:200000 (PF) injection 10 mL (10 mLs Infiltration Given by Other 05/19/21 1423)    ED Course  I have reviewed the triage vital signs and the nursing notes.  Pertinent labs & imaging results that were available during my care of the patient were reviewed by me and considered in my medical decision making (see chart for details).  Clinical Course as of 05/19/21 1555  Thu May 19, 2021  1268 50 year old male with history of diabetes presents with pilonidal abscess, CBG 443, non compliant with his medication.  Patient is afebrile, is tachycardic. Plan is to give IV fluids and reassess vitals and CBG. CBC with WBC 12.1, Na 130 likely will improve at blood glucose improves. I&D with packing placed, tolerated procedure well. Advised to return for packing removal in 2 days (discussed non ER options for recheck and removal as well), referred to  general surgery, discussed possibility of recurrence.  [LM]    Clinical Course User Index [LM] Alden Hipp   MDM Rules/Calculators/A&P                            Final Clinical Impression(s) / ED Diagnoses Final diagnoses:  Pilonidal abscess  Hyperglycemia due to diabetes mellitus (HCC)    Rx / DC Orders ED Discharge Orders          Ordered    clindamycin (CLEOCIN) 300 MG capsule  3 times daily        05/19/21 1507             Jeannie Fend, PA-C 05/19/21 1555    Gwyneth Sprout, MD 05/20/21 1514

## 2021-05-19 NOTE — ED Triage Notes (Signed)
Pilonidal cyst x 4 days. Hx of diabetes.

## 2021-06-08 ENCOUNTER — Ambulatory Visit: Payer: BLUE CROSS/BLUE SHIELD | Admitting: Physician Assistant

## 2021-06-08 NOTE — Progress Notes (Deleted)
Patient ID: Vernon Floyd, male   DOB: 1971-06-11, 50 y.o.   MRN: [id number]   After being seen in the ED 05/19/2021 for pilonidal abscess with I&D.  Blood sugar 443(down to 345 in ED) with electrolyte imbalance.  Saw Dr Rayburn Ma in f/up 05/24/2021.

## 2021-07-09 ENCOUNTER — Other Ambulatory Visit: Payer: Self-pay

## 2021-07-09 ENCOUNTER — Emergency Department (HOSPITAL_BASED_OUTPATIENT_CLINIC_OR_DEPARTMENT_OTHER)
Admission: EM | Admit: 2021-07-09 | Discharge: 2021-07-09 | Disposition: A | Discharge status: Home / Self Care | Payer: BLUE CROSS/BLUE SHIELD | Attending: Emergency Medicine | Admitting: Emergency Medicine

## 2021-07-09 ENCOUNTER — Encounter (HOSPITAL_BASED_OUTPATIENT_CLINIC_OR_DEPARTMENT_OTHER): Payer: Self-pay | Admitting: *Deleted

## 2021-07-09 DIAGNOSIS — R739 Hyperglycemia, unspecified: Secondary | ICD-10-CM

## 2021-07-09 DIAGNOSIS — Z794 Long term (current) use of insulin: Secondary | ICD-10-CM | POA: Insufficient documentation

## 2021-07-09 DIAGNOSIS — Z79899 Other long term (current) drug therapy: Secondary | ICD-10-CM | POA: Insufficient documentation

## 2021-07-09 DIAGNOSIS — E1165 Type 2 diabetes mellitus with hyperglycemia: Secondary | ICD-10-CM | POA: Insufficient documentation

## 2021-07-09 DIAGNOSIS — I10 Essential (primary) hypertension: Secondary | ICD-10-CM | POA: Insufficient documentation

## 2021-07-09 LAB — COMPREHENSIVE METABOLIC PANEL
ALT: 45 U/L — ABNORMAL HIGH (ref 0–44)
AST: 25 U/L (ref 15–41)
Albumin: 3.9 g/dL (ref 3.5–5.0)
Alkaline Phosphatase: 83 U/L (ref 38–126)
Anion gap: 8 (ref 5–15)
BUN: 17 mg/dL (ref 6–20)
CO2: 22 mmol/L (ref 22–32)
Calcium: 9.3 mg/dL (ref 8.9–10.3)
Chloride: 100 mmol/L (ref 98–111)
Creatinine, Ser: 1.13 mg/dL (ref 0.61–1.24)
GFR, Estimated: >60 mL/min (ref >60)
Glucose, Bld: 485 mg/dL — ABNORMAL HIGH (ref 70–99)
Potassium: 4.3 mmol/L (ref 3.5–5.1)
Sodium: 130 mmol/L — ABNORMAL LOW (ref 135–145)
Total Bilirubin: 0.4 mg/dL (ref 0.3–1.2)
Total Protein: 8 g/dL (ref 6.5–8.1)

## 2021-07-09 LAB — CBC WITH DIFFERENTIAL/PLATELET
Abs Immature Granulocytes: 0.02 10*3/uL (ref 0.00–0.07)
Basophils Absolute: 0 10*3/uL (ref 0.0–0.1)
Basophils Relative: 0 %
Eosinophils Absolute: 0.1 10*3/uL (ref 0.0–0.5)
Eosinophils Relative: 1 %
HCT: 47.6 % (ref 39.0–52.0)
Hemoglobin: 16.6 g/dL (ref 13.0–17.0)
Immature Granulocytes: 0 %
Lymphocytes Relative: 29 %
Lymphs Abs: 2 10*3/uL (ref 0.7–4.0)
MCH: 31.1 pg (ref 26.0–34.0)
MCHC: 34.9 g/dL (ref 30.0–36.0)
MCV: 89.1 fL (ref 80.0–100.0)
Monocytes Absolute: 0.5 10*3/uL (ref 0.1–1.0)
Monocytes Relative: 7 %
Neutro Abs: 4.2 10*3/uL (ref 1.7–7.7)
Neutrophils Relative %: 63 %
Platelets: 263 10*3/uL (ref 150–400)
RBC: 5.34 MIL/uL (ref 4.22–5.81)
RDW: 13.5 % (ref 11.5–15.5)
WBC: 6.8 10*3/uL (ref 4.0–10.5)
nRBC: 0 % (ref 0.0–0.2)

## 2021-07-09 LAB — I-STAT VENOUS BLOOD GAS, ED
Acid-Base Excess: 1 mmol/L (ref 0.0–2.0)
Bicarbonate: 26.1 mmol/L (ref 20.0–28.0)
Calcium, Ion: 1.25 mmol/L (ref 1.15–1.40)
HCT: 51 % (ref 39.0–52.0)
Hemoglobin: 17.3 g/dL — ABNORMAL HIGH (ref 13.0–17.0)
O2 Saturation: 99 %
Potassium: 4.5 mmol/L (ref 3.5–5.1)
Sodium: 134 mmol/L — ABNORMAL LOW (ref 135–145)
TCO2: 27 mmol/L (ref 22–32)
pCO2, Ven: 42.1 mmHg — ABNORMAL LOW (ref 44.0–60.0)
pH, Ven: 7.4 (ref 7.250–7.430)
pO2, Ven: 122 mmHg — ABNORMAL HIGH (ref 32.0–45.0)

## 2021-07-09 LAB — URINALYSIS, ROUTINE W REFLEX MICROSCOPIC
Bilirubin Urine: NEGATIVE
Glucose, UA: >=500 mg/dL — AB
Ketones, ur: NEGATIVE mg/dL
Leukocytes,Ua: NEGATIVE
Nitrite: NEGATIVE
Protein, ur: NEGATIVE mg/dL
Specific Gravity, Urine: 1.005 (ref 1.005–1.030)
pH: 6 (ref 5.0–8.0)

## 2021-07-09 LAB — URINALYSIS, MICROSCOPIC (REFLEX): WBC, UA: NONE SEEN WBC/hpf (ref 0–5)

## 2021-07-09 LAB — CBG MONITORING, ED
Glucose-Capillary: 453 mg/dL — ABNORMAL HIGH (ref 70–99)
Glucose-Capillary: 446 mg/dL — ABNORMAL HIGH (ref 70–99)

## 2021-07-09 MED ORDER — INSULIN ASPART 100 UNIT/ML IJ SOLN
5.0000 [IU] | Freq: Once | INTRAMUSCULAR | Status: AC
  Administered 2021-07-09: 5 [IU] via SUBCUTANEOUS

## 2021-07-09 MED ORDER — SODIUM CHLORIDE 0.9 % IV BOLUS
1000.0000 mL | Freq: Once | INTRAVENOUS | Status: AC
  Administered 2021-07-09: 1000 mL via INTRAVENOUS

## 2021-07-09 MED ORDER — OZEMPIC (0.25 OR 0.5 MG/DOSE) 2 MG/1.5ML ~~LOC~~ SOPN: 0.5000 mg | PEN_INJECTOR | SUBCUTANEOUS | 2 refills | Status: AC

## 2021-07-09 NOTE — ED Provider Notes (Signed)
  Physical Exam  BP 135/89   Pulse 85   Temp 98.6 F (37 C) (Oral)   Resp 18   Ht 5\' 11"  (1.803 m)   Wt 119.7 kg   SpO2 97%   BMI 36.82 kg/m   Physical Exam  ED Course/Procedures     Procedures  MDM  Received care of patient from Dr. . Please see his note for prior history, physical and care. Presents with hyperglycemia off of medication for several months, no sign of DKA>  Given IV fluids and subcutaneous insulin.  Glucose decreased, expect it to continue to decrease with [place] insulin, recommend continued monitoring of glucose and beginning ozempic with strict return precautions.  Patient discharged in stable condition with understanding of reasons to return.        Lockie Mola, MD 07/11/21 [phone]

## 2021-07-09 NOTE — ED Triage Notes (Signed)
Pt reports out of blood sugar meds for several months. Reports increased urinating x 2 weeks. States his blood sugar was 574 just prior to arrival. States had eaten breakfast and had 2 sodas this am

## 2021-07-09 NOTE — Discharge Instructions (Addendum)
Take your Ozempic as prescribed.  Follow-up with primary care doctor/endocrinology.

## 2021-07-09 NOTE — ED Provider Notes (Signed)
[facility] EMERGENCY DEPARTMENT Provider Note   CSN: [id number] Arrival date & time: 07/09/21  1352     History Chief Complaint  Patient presents with   Hyperglycemia    Vernon Floyd is a 50 y.o. male.  The history is provided by the patient.  Hyperglycemia Blood sugar level PTA:  >500 Severity:  Mild Onset quality:  Gradual Timing:  Constant Progression:  Unchanged Chronicity:  New Context: noncompliance   Relieved by:  Nothing Ineffective treatments:  None tried Associated symptoms: polyuria   Associated symptoms: no abdominal pain, no altered mental status, no chest pain, no dysuria, no fever, no shortness of breath and no vomiting       Past Medical History:  Diagnosis Date   Diabetes mellitus without complication (HCC)    Hypertension    Obesity     Patient Active Problem List   Diagnosis Date Noted   Exposure to STD 09/29/2016    Past Surgical History:  Procedure Laterality Date   KNEE ARTHROSCOPY         No family history on file.  Social History   Tobacco Use   Smoking status: Never   Smokeless tobacco: Never  Vaping Use   Vaping Use: Never used  Substance Use Topics   Alcohol use: Yes    Comment: occ   Drug use: Not Currently    Types: Marijuana    Home Medications Prior to Admission medications   Medication Sig Start Date End Date Taking? Authorizing Provider  Semaglutide,0.25 or 0.5MG /DOS, (OZEMPIC, 0.25 OR 0.5 MG/DOSE,) 2 MG/1.5ML SOPN Inject 0.5 mg into the skin once a week. 07/09/21 08/08/21 Yes Darrion Wyszynski, DO  atorvastatin (LIPITOR) 20 MG tablet Take 1 tablet (20 mg total) by mouth daily. 08/18/20   Hoy Register, MD  doxycycline (VIBRAMYCIN) 100 MG capsule Take 1 capsule (100 mg total) by mouth 2 (two) times daily. 03/17/21   Robinson, [place] N, PA-C  Insulin Pen Needle 31G X 5 MM MISC 1 each by Does not apply route at bedtime. 09/08/19   Hoy Register, MD  lisinopril (ZESTRIL) 5 MG tablet Take 1 tablet (5 mg  total) by mouth daily. 08/18/20 09/17/20  Hoy Register, MD  methocarbamol (ROBAXIN) 500 MG tablet Take 1 tablet (500 mg total) by mouth 2 (two) times daily. 02/25/20   Gailen Shelter, PA  PRESCRIPTION MEDICATION Take 1 tablet by mouth daily. Cholesterol    [provider]    Allergies    Patient has no known allergies.  Review of Systems   Review of Systems  Constitutional:  Negative for chills and fever.  HENT:  Negative for ear pain and sore throat.   Eyes:  Negative for pain and visual disturbance.  Respiratory:  Negative for cough and shortness of breath.   Cardiovascular:  Negative for chest pain and palpitations.  Gastrointestinal:  Negative for abdominal pain and vomiting.  Endocrine: Positive for polyuria.  Genitourinary:  Negative for dysuria and hematuria.  Musculoskeletal:  Negative for arthralgias and back pain.  Skin:  Negative for color change and rash.  Neurological:  Negative for seizures and syncope.  All other systems reviewed and are negative.  Physical Exam Updated Vital Signs BP (!) 138/101 (BP Location: Left Arm)   Pulse (!) 106   Temp 98.6 F (37 C) (Oral)   Resp 18   Ht 5\' 11"  (1.803 m)   Wt 119.7 kg   SpO2 97%   BMI 36.82 kg/m   Physical Exam Vitals  and nursing note reviewed.  Constitutional:      Appearance: He is well-developed.  HENT:     Head: Normocephalic and atraumatic.     Nose: Nose normal.     Mouth/Throat:     Mouth: Mucous membranes are moist.  Eyes:     Extraocular Movements: Extraocular movements intact.     Conjunctiva/sclera: Conjunctivae normal.     Pupils: Pupils are equal, round, and reactive to light.  Cardiovascular:     Rate and Rhythm: Normal rate and regular rhythm.     Pulses: Normal pulses.     Heart sounds: Normal heart sounds. No murmur heard. Pulmonary:     Effort: Pulmonary effort is normal. No respiratory distress.     Breath sounds: Normal breath sounds.  Abdominal:     Palpations: Abdomen  is soft.     Tenderness: There is no abdominal tenderness.  Musculoskeletal:     Cervical back: Neck supple.  Skin:    General: Skin is warm and dry.     Capillary Refill: Capillary refill takes less than 2 seconds.  Neurological:     General: No focal deficit present.     Mental Status: He is alert.  Psychiatric:        Mood and Affect: Mood normal.    ED Results / Procedures / Treatments   Labs (all labs ordered are listed, but only abnormal results are displayed) Labs Reviewed  COMPREHENSIVE METABOLIC PANEL - Abnormal; Notable for the following components:      Result Value   Sodium 130 (*)    Glucose, Bld 485 (*)    ALT 45 (*)    All other components within normal limits  URINALYSIS, ROUTINE W REFLEX MICROSCOPIC - Abnormal; Notable for the following components:   Color, Urine STRAW (*)    Glucose, UA >=500 (*)    Hgb urine dipstick SMALL (*)    All other components within normal limits  URINALYSIS, MICROSCOPIC (REFLEX) - Abnormal; Notable for the following components:   Bacteria, UA RARE (*)    All other components within normal limits  CBG MONITORING, ED - Abnormal; Notable for the following components:   Glucose-Capillary 453 (*)    All other components within normal limits  I-STAT VENOUS BLOOD GAS, ED - Abnormal; Notable for the following components:   pCO2, Ven 42.1 (*)    pO2, Ven 122.0 (*)    Sodium 134 (*)    Hemoglobin 17.3 (*)    All other components within normal limits  CBC WITH DIFFERENTIAL/PLATELET  BLOOD GAS, VENOUS    EKG None  Radiology No results found.  Procedures Procedures   Medications Ordered in ED Medications  sodium chloride 0.9 % bolus 1,000 mL (1,000 mLs Intravenous New Bag/Given 07/09/21 1433)  insulin aspart (novoLOG) injection 5 Units (5 Units Subcutaneous Given 07/09/21 1452)    ED Course  I have reviewed the triage vital signs and the nursing notes.  Pertinent labs & imaging results that were available during my care of  the patient were reviewed by me and considered in my medical decision making (see chart for details).    MDM Rules/Calculators/A&P                           Vernon Floyd is here for hyperglycemia.  Blood sugar has been in the 500s this morning.  450 upon arrival here.  States that he had some soda prior to arrival.  He  has not been with his Ozempic for several months.  He does not have a primary care doctor anymore.  He states that he felt comfortable with the lower dose of Ozempic and overall we will represcribed that.  Lab work today does not show any evidence of DKA.  Patient with normal pH.  No significant leukocytosis or anemia.  Given IV fluids, IV insulin here in the ED.  Have placed referral for primary care, endocrinology as well as gave him options for other primary care doctor.  He states that he has a glucometer and other things to check his blood sugar at home already.  We will give him several refills of his Ozempic.  Awaiting CMP.  CMP is overall unremarkable.  Overall patient with hyperglycemia but not DKA.  In the setting of noncompliance.  Blood sugar has improved with insulin and IV fluids.  Have refilled his Ozempic.  Discharged in good condition.  Understands dietary restrictions as well.  Understands return precautions.  This chart was dictated using voice recognition software.  Despite best efforts to proofread,  errors can occur which can change the documentation meaning.   Final Clinical Impression(s) / ED Diagnoses Final diagnoses:  Hyperglycemia    Rx / DC Orders ED Discharge Orders          Ordered    Ambulatory referral to Endocrinology        07/09/21 1418    Ambulatory referral to [facility]        07/09/21 1418    Semaglutide,0.25 or 0.5MG /DOS, (OZEMPIC, 0.25 OR 0.5 MG/DOSE,) 2 MG/1.5ML [facility]  Weekly        07/09/21 1422             Virgina Norfolk, DO 07/09/21 1515

## 2021-11-17 ENCOUNTER — Other Ambulatory Visit: Payer: Self-pay

## 2021-11-17 ENCOUNTER — Emergency Department (HOSPITAL_BASED_OUTPATIENT_CLINIC_OR_DEPARTMENT_OTHER)
Admission: EM | Admit: 2021-11-17 | Discharge: 2021-11-17 | Disposition: A | Discharge status: Home / Self Care | Payer: BLUE CROSS/BLUE SHIELD | Attending: Emergency Medicine | Admitting: Emergency Medicine

## 2021-11-17 ENCOUNTER — Encounter (HOSPITAL_BASED_OUTPATIENT_CLINIC_OR_DEPARTMENT_OTHER): Payer: Self-pay | Admitting: Emergency Medicine

## 2021-11-17 DIAGNOSIS — E1065 Type 1 diabetes mellitus with hyperglycemia: Secondary | ICD-10-CM | POA: Diagnosis present

## 2021-11-17 DIAGNOSIS — R739 Hyperglycemia, unspecified: Secondary | ICD-10-CM

## 2021-11-17 DIAGNOSIS — Z794 Long term (current) use of insulin: Secondary | ICD-10-CM | POA: Insufficient documentation

## 2021-11-17 DIAGNOSIS — Z79899 Other long term (current) drug therapy: Secondary | ICD-10-CM | POA: Diagnosis not present

## 2021-11-17 DIAGNOSIS — I10 Essential (primary) hypertension: Secondary | ICD-10-CM | POA: Diagnosis not present

## 2021-11-17 LAB — COMPREHENSIVE METABOLIC PANEL
ALT: 37 U/L (ref 0–44)
AST: 24 U/L (ref 15–41)
Albumin: 3.4 g/dL — ABNORMAL LOW (ref 3.5–5.0)
Alkaline Phosphatase: 65 U/L (ref 38–126)
Anion gap: 8 (ref 5–15)
BUN: 16 mg/dL (ref 6–20)
CO2: 24 mmol/L (ref 22–32)
Calcium: 8.3 mg/dL — ABNORMAL LOW (ref 8.9–10.3)
Chloride: 99 mmol/L (ref 98–111)
Creatinine, Ser: 0.91 mg/dL (ref 0.61–1.24)
GFR, Estimated: >60 mL/min (ref >60)
Glucose, Bld: 400 mg/dL — ABNORMAL HIGH (ref 70–99)
Potassium: 3.9 mmol/L (ref 3.5–5.1)
Sodium: 131 mmol/L — ABNORMAL LOW (ref 135–145)
Total Bilirubin: 0.4 mg/dL (ref 0.3–1.2)
Total Protein: 7.3 g/dL (ref 6.5–8.1)

## 2021-11-17 LAB — CBC WITH DIFFERENTIAL/PLATELET
Abs Immature Granulocytes: 0.02 10*3/uL (ref 0.00–0.07)
Basophils Absolute: 0 10*3/uL (ref 0.0–0.1)
Basophils Relative: 0 %
Eosinophils Absolute: 0.2 10*3/uL (ref 0.0–0.5)
Eosinophils Relative: 2 %
HCT: 43.6 % (ref 39.0–52.0)
Hemoglobin: 15.3 g/dL (ref 13.0–17.0)
Immature Granulocytes: 0 %
Lymphocytes Relative: 34 %
Lymphs Abs: 2.5 10*3/uL (ref 0.7–4.0)
MCH: 31.4 pg (ref 26.0–34.0)
MCHC: 35.1 g/dL (ref 30.0–36.0)
MCV: 89.5 fL (ref 80.0–100.0)
Monocytes Absolute: 0.6 10*3/uL (ref 0.1–1.0)
Monocytes Relative: 8 %
Neutro Abs: 4.1 10*3/uL (ref 1.7–7.7)
Neutrophils Relative %: 56 %
Platelets: 260 10*3/uL (ref 150–400)
RBC: 4.87 MIL/uL (ref 4.22–5.81)
RDW: 13.8 % (ref 11.5–15.5)
WBC: 7.4 10*3/uL (ref 4.0–10.5)
nRBC: 0 % (ref 0.0–0.2)

## 2021-11-17 LAB — CBG MONITORING, ED
Glucose-Capillary: 385 mg/dL — ABNORMAL HIGH (ref 70–99)
Glucose-Capillary: 268 mg/dL — ABNORMAL HIGH (ref 70–99)

## 2021-11-17 MED ORDER — OZEMPIC (1 MG/DOSE) 4 MG/3ML ~~LOC~~ SOPN: 1.0000 mg | PEN_INJECTOR | SUBCUTANEOUS | 1 refills | Status: DC

## 2021-11-17 MED ORDER — SODIUM CHLORIDE 0.9 % IV BOLUS
1000.0000 mL | Freq: Once | INTRAVENOUS | Status: AC
  Administered 2021-11-17: 1000 mL via INTRAVENOUS

## 2021-11-17 MED ORDER — INSULIN ASPART 100 UNIT/ML IJ SOLN
8.0000 [IU] | Freq: Once | INTRAMUSCULAR | Status: AC
  Administered 2021-11-17: 8 [IU] via INTRAVENOUS

## 2021-11-17 NOTE — ED Provider Notes (Signed)
[facility] EMERGENCY DEPARTMENT Provider Note   CSN: [id number] Arrival date & time: 11/17/21  0249     History  Chief Complaint  Patient presents with   Hyperglycemia    Vernon Floyd is a 51 y.o. male.  Patient is a 51 year old male with history of type 1 diabetes, hypertension, hyperlipidemia.  Patient presenting today with complaints of elevated blood sugar.  He describes frequent urination and thirst.  He has been off of his Ozempic for many months due to being unable to afford the medication.  This evening, he decided to come to the ER after he checked his blood sugar and it was 450 after not having checked his blood pressure for quite some time.  He denies any chest pain or difficulty breathing.  The history is provided by the patient.  Hyperglycemia Blood sugar level PTA:  450 Severity:  Moderate Timing:  Constant     Home Medications Prior to Admission medications   Medication Sig Start Date End Date Taking? Authorizing Provider  atorvastatin (LIPITOR) 20 MG tablet Take 1 tablet (20 mg total) by mouth daily. 08/18/20   Hoy Register, MD  doxycycline (VIBRAMYCIN) 100 MG capsule Take 1 capsule (100 mg total) by mouth 2 (two) times daily. 03/17/21   Robinson, [place] N, PA-C  Insulin Pen Needle 31G X 5 MM MISC 1 each by Does not apply route at bedtime. 09/08/19   Hoy Register, MD  lisinopril (ZESTRIL) 5 MG tablet Take 1 tablet (5 mg total) by mouth daily. 08/18/20 09/17/20  Hoy Register, MD  methocarbamol (ROBAXIN) 500 MG tablet Take 1 tablet (500 mg total) by mouth 2 (two) times daily. 02/25/20   Gailen Shelter, PA  PRESCRIPTION MEDICATION Take 1 tablet by mouth daily. Cholesterol    [provider]      Allergies    Patient has no known allergies.    Review of Systems   Review of Systems  All other systems reviewed and are negative.  Physical Exam Updated Vital Signs BP (!) 141/102    Pulse 82    Temp 98 F (36.7 C) (Oral)    Resp 18     Ht 5\' 11"  (1.803 m)    Wt 136.1 kg    SpO2 99%    BMI 41.84 kg/m  Physical Exam Vitals and nursing note reviewed.  Constitutional:      General: He is not in acute distress.    Appearance: He is well-developed. He is not diaphoretic.  HENT:     Head: Normocephalic and atraumatic.  Cardiovascular:     Rate and Rhythm: Normal rate and regular rhythm.     Heart sounds: No murmur heard.   No friction rub.  Pulmonary:     Effort: Pulmonary effort is normal. No respiratory distress.     Breath sounds: Normal breath sounds. No wheezing or rales.  Abdominal:     General: Bowel sounds are normal. There is no distension.     Palpations: Abdomen is soft.     Tenderness: There is no abdominal tenderness.  Musculoskeletal:        General: Normal range of motion.     Cervical back: Normal range of motion and neck supple.  Skin:    General: Skin is warm and dry.  Neurological:     General: No focal deficit present.     Mental Status: He is alert and oriented to person, place, and time.     Coordination: Coordination normal.  ED Results / Procedures / Treatments   Labs (all labs ordered are listed, but only abnormal results are displayed) Labs Reviewed  COMPREHENSIVE METABOLIC PANEL  CBC WITH DIFFERENTIAL/PLATELET  CBG MONITORING, ED    EKG None  Radiology No results found.  Procedures Procedures    Medications Ordered in ED Medications  sodium chloride 0.9 % bolus 1,000 mL (has no administration in time range)    ED Course/ Medical Decision Making/ A&P  This patient presents to the ED for concern of elevated blood sugar, this involves an extensive number of treatment options, and is a complaint that carries with it a high risk of complications and morbidity.  The differential diagnosis includes hyperglycemia, diabetic ketoacidosis     Co morbidities that complicate the patient evaluation   None     Additional history obtained:   No additional contributors to  the history External records from outside source obtained and reviewed including clinic notes     Lab Tests:   I Ordered, and personally interpreted labs.  The pertinent results include: CBC and basic metabolic panel.  Patient has a hyperglycemia but no evidence for ketoacidosis     Imaging Studies ordered:   No imaging studies ordered or indicated     Cardiac Monitoring:   No cardiac monitoring necessary     Medicines ordered and prescription drug management:   I ordered medication including insulin for hyperglycemia Reevaluation of the patient after these medicines showed that the patient improved I have reviewed the patients home medicines and have made adjustments as needed     Test Considered:   No other test considered ordered     Critical Interventions:   IV fluids and insulin     Consultations Obtained:   No additional consultations requested or obtained     Problem List / ED Course:   Patient presenting with elevated blood sugar.  He does describe thirst and increased urination, but has no other symptoms.  Electrolytes reflect hyperglycemia, but no ketoacidosis.  He was given IV fluids and insulin with improvement in his blood sugar.  He has been off of his diabetic medications for many months secondary to financial reasons/insurance reasons.  At this point, I will prescribe his Ozempic and have him follow-up with his primary doctor.  He is to keep a record of his blood sugars at home.     Reevaluation:   After the interventions noted above, I reevaluated the patient and found that they have :improved     Social Determinants of Health:   Patient has obstacles to medications including access to primary doctor, health insurance, and financial limitations     Dispostion:   After consideration of the diagnostic results and the patients response to treatment, I feel that the patent would benefit from restarting his diabetes medications and follow-up with  primary doctor..   Final Clinical Impression(s) / ED Diagnoses Final diagnoses:  None    Rx / DC Orders ED Discharge Orders     None         Geoffery Lyons, MD 11/17/21 [phone]

## 2021-11-17 NOTE — ED Provider Notes (Deleted)
[facility] EMERGENCY DEPARTMENT Provider Note   CSN: [id number] Arrival date & time: 11/17/21  0249     History  Chief Complaint  Patient presents with   Hyperglycemia    Vernon Floyd is a 51 y.o. male.   Hyperglycemia     Home Medications Prior to Admission medications   Medication Sig Start Date End Date Taking? Authorizing Provider  atorvastatin (LIPITOR) 20 MG tablet Take 1 tablet (20 mg total) by mouth daily. 08/18/20   Hoy Register, MD  doxycycline (VIBRAMYCIN) 100 MG capsule Take 1 capsule (100 mg total) by mouth 2 (two) times daily. 03/17/21   Robinson, [place] N, PA-C  Insulin Pen Needle 31G X 5 MM MISC 1 each by Does not apply route at bedtime. 09/08/19   Hoy Register, MD  lisinopril (ZESTRIL) 5 MG tablet Take 1 tablet (5 mg total) by mouth daily. 08/18/20 09/17/20  Hoy Register, MD  methocarbamol (ROBAXIN) 500 MG tablet Take 1 tablet (500 mg total) by mouth 2 (two) times daily. 02/25/20   Gailen Shelter, PA  PRESCRIPTION MEDICATION Take 1 tablet by mouth daily. Cholesterol    [provider]      Allergies    Patient has no known allergies.    Review of Systems   Review of Systems  Physical Exam Updated Vital Signs BP (!) 141/102    Pulse 82    Temp 98 F (36.7 C) (Oral)    Resp 18    Ht 5\' 11"  (1.803 m)    Wt 136.1 kg    SpO2 99%    BMI 41.84 kg/m  Physical Exam  ED Results / Procedures / Treatments   Labs (all labs ordered are listed, but only abnormal results are displayed) Labs Reviewed  COMPREHENSIVE METABOLIC PANEL - Abnormal; Notable for the following components:      Result Value   Sodium 131 (*)    Glucose, Bld 400 (*)    Calcium 8.3 (*)    Albumin 3.4 (*)    All other components within normal limits  CBG MONITORING, ED - Abnormal; Notable for the following components:   Glucose-Capillary 385 (*)    All other components within normal limits  CBG MONITORING, ED - Abnormal; Notable for the following components:    Glucose-Capillary 268 (*)    All other components within normal limits  CBC WITH DIFFERENTIAL/PLATELET    EKG None  Radiology No results found.  Procedures Procedures    Medications Ordered in ED Medications  sodium chloride 0.9 % bolus 1,000 mL ( Intravenous Stopped 11/17/21 0422)  insulin aspart (novoLOG) injection 8 Units (8 Units Intravenous Given 11/17/21 0327)    ED Course/ Medical Decision Making/ A&P  This patient presents to the ED for concern of elevated blood sugar, this involves an extensive number of treatment options, and is a complaint that carries with it a high risk of complications and morbidity.  The differential diagnosis includes hyperglycemia, diabetic ketoacidosis   Co morbidities that complicate the patient evaluation  None   Additional history obtained:  No additional contributors to the history External records from outside source obtained and reviewed including clinic notes   Lab Tests:  I Ordered, and personally interpreted labs.  The pertinent results include: CBC and basic metabolic panel.  Patient has a hyperglycemia but no evidence for ketoacidosis   Imaging Studies ordered:  No imaging studies ordered or indicated   Cardiac Monitoring:  No cardiac monitoring necessary   Medicines ordered and  prescription drug management:  I ordered medication including insulin for hyperglycemia Reevaluation of the patient after these medicines showed that the patient improved I have reviewed the patients home medicines and have made adjustments as needed   Test Considered:  No other test considered ordered   Critical Interventions:  IV fluids and insulin   Consultations Obtained:  No additional consultations requested or obtained   Problem List / ED Course:  Patient presenting with elevated blood sugar.  He does describe thirst and increased urination, but has no other symptoms.  Electrolytes reflect hyperglycemia, but no  ketoacidosis.  He was given IV fluids and insulin with improvement in his blood sugar.  He has been off of his diabetic medications for many months secondary to financial reasons/insurance reasons.  At this point, I will prescribe his Ozempic and have him follow-up with his primary doctor.  He is to keep a record of his blood sugars at home.   Reevaluation:  After the interventions noted above, I reevaluated the patient and found that they have :improved   Social Determinants of Health:  Patient has obstacles to medications including access to primary doctor, health insurance, and financial limitations   Dispostion:  After consideration of the diagnostic results and the patients response to treatment, I feel that the patent would benefit from restarting his diabetes medications and follow-up with primary doctor..    Final Clinical Impression(s) / ED Diagnoses Final diagnoses:  None    Rx / DC Orders ED Discharge Orders     None         Geoffery Lyons, MD 11/17/21 [phone]

## 2021-11-17 NOTE — Discharge Instructions (Signed)
Begin taking Ozempic as previously prescribed.  A refill of this has been given to you today.  Follow-up with primary doctor in the next week for a recheck.

## 2021-11-17 NOTE — ED Triage Notes (Addendum)
Pt reports elevated blood sugar tonight. Pt states he has no been able to afford his ozempic pen.

## 2021-12-13 ENCOUNTER — Ambulatory Visit: Payer: BLUE CROSS/BLUE SHIELD | Admitting: Internal Medicine

## 2022-01-07 ENCOUNTER — Other Ambulatory Visit: Payer: Self-pay

## 2022-01-07 ENCOUNTER — Emergency Department (HOSPITAL_BASED_OUTPATIENT_CLINIC_OR_DEPARTMENT_OTHER)
Admission: EM | Admit: 2022-01-07 | Discharge: 2022-01-07 | Disposition: A | Discharge status: Home / Self Care | Payer: BLUE CROSS/BLUE SHIELD | Attending: Emergency Medicine | Admitting: Emergency Medicine

## 2022-01-07 ENCOUNTER — Encounter (HOSPITAL_BASED_OUTPATIENT_CLINIC_OR_DEPARTMENT_OTHER): Payer: Self-pay | Admitting: Emergency Medicine

## 2022-01-07 DIAGNOSIS — Z794 Long term (current) use of insulin: Secondary | ICD-10-CM | POA: Insufficient documentation

## 2022-01-07 DIAGNOSIS — E119 Type 2 diabetes mellitus without complications: Secondary | ICD-10-CM | POA: Insufficient documentation

## 2022-01-07 DIAGNOSIS — Z202 Contact with and (suspected) exposure to infections with a predominantly sexual mode of transmission: Secondary | ICD-10-CM | POA: Insufficient documentation

## 2022-01-07 LAB — URINALYSIS, ROUTINE W REFLEX MICROSCOPIC
Bilirubin Urine: NEGATIVE
Glucose, UA: 250 mg/dL — AB
Hgb urine dipstick: NEGATIVE
Leukocytes,Ua: NEGATIVE
Nitrite: NEGATIVE
Specific Gravity, Urine: 1.027 (ref 1.005–1.030)
pH: 6 (ref 5.0–8.0)

## 2022-01-07 NOTE — ED Provider Notes (Signed)
? ?[facility] EMER[facility] DEPT  ?Provider Note ? ?CSN: [id number] ?Arrival date & time: 01/07/22 0146 ? ?History ?Chief Complaint  ?Patient presents with  ? Exposure to STD  ? ? ?Vernon Floyd is a 51 y.o. male with history of DM reports he has been having unprotected sex with a woman in the last few weeks. She called him and told him she was going to the doctor but he is unable to say whether she had a positive STD test or not. He has not been having any symptoms but decided to come get checked out.  ? ? ?Home Medications ?Prior to Admission medications   ?Medication Sig Start Date End Date Taking? Authorizing Provider  ?atorvastatin (LIPITOR) 20 MG tablet Take 1 tablet (20 mg total) by mouth daily. 08/18/20   Charlott Rakes, MD  ?doxycycline (VIBRAMYCIN) 100 MG capsule Take 1 capsule (100 mg total) by mouth 2 (two) times daily. 03/17/21   Robinson, [place] N, PA-C  ?Insulin Pen Needle 31G X 5 MM MISC 1 each by Does not apply route at bedtime. 09/08/19   Charlott Rakes, MD  ?lisinopril (ZESTRIL) 5 MG tablet Take 1 tablet (5 mg total) by mouth daily. 08/18/20 09/17/20  Charlott Rakes, MD  ?methocarbamol (ROBAXIN) 500 MG tablet Take 1 tablet (500 mg total) by mouth 2 (two) times daily. 02/25/20   Tedd Sias, PA  ?PRESCRIPTION MEDICATION Take 1 tablet by mouth daily. Cholesterol    [provider]  ?Semaglutide, 1 MG/DOSE, (OZEMPIC, 1 MG/DOSE,) 4 MG/3ML SOPN Inject 1 mg into the skin once a week. 11/17/21   Veryl Speak, MD  ? ? ? ?Allergies    ?Patient has no known allergies. ? ? ?Review of Systems   ?Review of Systems ?Please see HPI for pertinent positives and negatives ? ?Physical Exam ?BP 139/90 (BP Location: Right Arm)   Pulse 90   Temp 98.4 ?F (36.9 ?C) (Oral)   Resp 20   Ht 5\' 11"  (1.803 m)   Wt 129.3 kg   SpO2 95%   BMI 39.75 kg/m?  ? ?Physical Exam ?Vitals and nursing note reviewed.  ?HENT:  ?   Head: Normocephalic.  ?   Nose: Nose normal.  ?Eyes:  ?   Extraocular Movements:  Extraocular movements intact.  ?Pulmonary:  ?   Effort: Pulmonary effort is normal.  ?Musculoskeletal:     ?   General: Normal range of motion.  ?   Cervical back: Neck supple.  ?Skin: ?   Findings: No rash (on exposed skin).  ?Neurological:  ?   Mental Status: He is alert and oriented to person, place, and time.  ?Psychiatric:     ?   Mood and Affect: Mood normal.  ? ? ?ED Results / Procedures / Treatments   ?EKG ?None ? ?Procedures ?Procedures ? ?Medications Ordered in the ED ?Medications - No data to display ? ?Initial Impression and Plan ? Patient without any STD symptoms has had unprotected sex but no confirmed exposure/positive STD test in his partner. UA is clear. GC/CT sent but will defer treatment given lack of symptoms. Patient will get his result in [place], advised to follow up at PCP, Health Dept or UC if he develops symptoms or has a positive result.  ? ?ED Course  ? ?  ? ? ?MDM Rules/Calculators/A&P ?Medical Decision Making ?Problems Addressed: ?Possible exposure to STD: acute illness or injury ? ?Amount and/or Complexity of Data Reviewed ?Labs: ordered. Decision-making details documented in ED Course. ? ? ? ?  Final Clinical Impression(s) / ED Diagnoses ?Final diagnoses:  ?Possible exposure to STD  ? ? ?Rx / DC Orders ?ED Discharge Orders   ? ? None  ? ?  ? ?  ?Truddie Hidden, MD ?01/07/22 0405 ? ?

## 2022-01-07 NOTE — ED Notes (Signed)
Discharge instructions including Community health resources discussed with pt. Pt verbalized understanding with no questions at this time.  ?

## 2022-01-07 NOTE — ED Notes (Signed)
Per lab GC/Chlamydia is a send out lab that can take 12-48 hours to result. Urine sample will be sent with morning carrier.  ?

## 2022-01-07 NOTE — ED Triage Notes (Signed)
?  Patient comes in with exposure to STD.  Patient states he received a phone call from a sexual partner last night letting him know that they tested positive for an STD.  Patient states he had unprotected sex with her about 3 days ago.  Patient has no STD symptoms but was concerned and wanted to be evaluated.  No dysuria, discharge, swelling, or scrotal pain.  No pain at this time.  ?

## 2022-01-09 LAB — GC/CHLAMYDIA PROBE AMP (~~LOC~~) NOT AT ARMC
Chlamydia: NEGATIVE
Comment: NEGATIVE
Comment: NORMAL
Neisseria Gonorrhea: NEGATIVE

## 2022-02-06 ENCOUNTER — Encounter: Payer: Self-pay | Admitting: Internal Medicine

## 2022-02-06 ENCOUNTER — Ambulatory Visit: Payer: BLUE CROSS/BLUE SHIELD | Admitting: Internal Medicine

## 2022-02-06 NOTE — Progress Notes (Deleted)
?Name: Vernon Floyd  ?MRN/ DOB: [id number], 1971-07-18   ?Age/ Sex: 51 y.o., male   ? ?PCP: Pcp, No   ?Reason for Endocrinology Evaluation: Type {NUMBERS 1 OR 2:522190} Diabetes Mellitus  ?   ?Date of Initial Endocrinology Visit: 02/06/2022   ? ? ?PATIENT IDENTIFIER: Vernon Floyd is a 51 y.o. male with a past medical history of T2DM, HTN, and dyslipidemia. The patient presented for initial endocrinology clinic visit on 02/06/2022 for consultative assistance with his diabetes management.  ? ? ?HPI: ?Vernon Floyd was  ? ? ?Diagnosed with DM *** ?Prior Medications tried/Intolerance: Glipizide, metformin ?Currently checking blood sugars *** x / day,  before breakfast and ***.  ?Hypoglycemia episodes : ***               Symptoms: ***                 Frequency: ***/  ?Hemoglobin A1c has ranged from 9.4% in 2020, peaking at 11.0% in 2021. ?Patient required assistance for hypoglycemia:  ?Patient has required hospitalization within the last 1 year from hyper or hypoglycemia:  ? ?In terms of diet, the patient *** ? ? ?HOME DIABETES REGIMEN: ?Ozempic 1 mg weekly ? ? ?Statin: Yes ?ACE-I/ARB: Yes ? ? ? ?METER DOWNLOAD SUMMARY: Date range evaluated: *** ?Fingerstick Blood Glucose Tests = *** ?Average Number Tests/Day = *** ?Overall Mean FS Glucose = *** ?Standard Deviation = *** ? ?BG Ranges: ?Low = *** ?High = *** ? ? ?Hypoglycemic Events/30 Days: ?BG < 50 = *** ?Episodes of symptomatic severe hypoglycemia = *** ? ? ?DIABETIC COMPLICATIONS: ?Microvascular complications:  ?*** ?Denies: CKD ?Last eye exam: Completed  ? ?Macrovascular complications:  ? ?Denies: CAD, PVD, CVA ? ? ?PAST HISTORY: ?Past Medical History:  ?Past Medical History:  ?Diagnosis Date  ? Diabetes mellitus without complication (HCC)   ? Hypertension   ? Obesity   ? ?Past Surgical History:  ?Past Surgical History:  ?Procedure Laterality Date  ? KNEE ARTHROSCOPY    ?  ?Social History:  reports that he has never smoked. He has never used smokeless tobacco. He  reports current alcohol use. He reports that he does not currently use drugs after having used the following drugs: Marijuana. ?Family History: No family history on file. ? ? ?HOME MEDICATIONS: ?Allergies as of 02/06/2022   ?No Known Allergies ?  ? ?  ?Medication List  ?  ? ?  ? Accurate as of February 06, 2022  7:17 AM. If you have any questions, ask your nurse or doctor.  ?  ?  ? ?  ? ?atorvastatin 20 MG tablet ?Commonly known as: LIPITOR ?Take 1 tablet (20 mg total) by mouth daily. ?  ?doxycycline 100 MG capsule ?Commonly known as: VIBRAMYCIN ?Take 1 capsule (100 mg total) by mouth 2 (two) times daily. ?  ?Insulin Pen Needle 31G X 5 MM Misc ?1 each by Does not apply route at bedtime. ?  ?lisinopril 5 MG tablet ?Commonly known as: ZESTRIL ?Take 1 tablet (5 mg total) by mouth daily. ?  ?methocarbamol 500 MG tablet ?Commonly known as: ROBAXIN ?Take 1 tablet (500 mg total) by mouth 2 (two) times daily. ?  ?Ozempic (1 MG/DOSE) 4 MG/3ML Sopn ?Generic drug: Semaglutide (1 MG/DOSE) ?Inject 1 mg into the skin once a week. ?  ?PRESCRIPTION MEDICATION ?Take 1 tablet by mouth daily. Cholesterol ?  ? ?  ? ? ? ?ALLERGIES: ?No Known Allergies ? ? ?REVIEW OF SYSTEMS: ?A comprehensive ROS was conducted with  the patient and is negative except as per HPI and below:  ?ROS ? ?  ?OBJECTIVE:  ? ?VITAL SIGNS: There were no vitals taken for this visit.  ? ?PHYSICAL EXAM:  ?General: Pt appears well and is in NAD  ?Hydration: Well-hydrated with moist mucous membranes and good skin turgor  ?HEENT: Head: Unremarkable with good dentition. Oropharynx clear without exudate.  ?Eyes: External eye exam normal without stare, lid lag or exophthalmos.  EOM intact.  PERRL.  ?Neck: General: Supple without adenopathy or carotid bruits. ?Thyroid: Thyroid size normal.  No goiter or nodules appreciated. No thyroid bruit.  ?Lungs: Clear with good BS bilat with no rales, rhonchi, or wheezes  ?Heart: RRR with normal S1 and S2 and no gallops; no murmurs; no rub   ?Abdomen: Normoactive bowel sounds, soft, nontender, without masses or organomegaly palpable  ?Extremities:  ?Lower extremities - No pretibial edema. No lesions.  ?Skin: Normal texture and temperature to palpation. No rash noted. No Acanthosis nigricans/skin tags. No lipohypertrophy.  ?Neuro: MS is good with appropriate affect, pt is alert and Ox3  ? ? ?DM foot exam:  ? ? ?DATA REVIEWED: ? ?Lab Results  ?Component Value Date  ? HGBA1C 11.0 (A) 08/18/2020  ? HGBA1C 9.4 (A) 09/08/2019  ? ? Latest Reference Range & Units 11/17/21 03:23  ?Sodium 135 - 145 mmol/L 131 (L)  ?Potassium 3.5 - 5.1 mmol/L 3.9  ?Chloride 98 - 111 mmol/L 99  ?CO2 22 - 32 mmol/L 24  ?Glucose 70 - 99 mg/dL 536 (H)  ?BUN 6 - 20 mg/dL 16  ?Creatinine 0.61 - 1.24 mg/dL 6.44  ?Calcium 8.9 - 10.3 mg/dL 8.3 (L)  ?Anion gap 5 - 15  8  ?Alkaline Phosphatase 38 - 126 U/L 65  ?Albumin 3.5 - 5.0 g/dL 3.4 (L)  ?AST 15 - 41 U/L 24  ?ALT 0 - 44 U/L 37  ?Total Protein 6.5 - 8.1 g/dL 7.3  ?Total Bilirubin 0.3 - 1.2 mg/dL 0.4  ?GFR, Estimated >60 mL/min >60  ?(L): Data is abnormally low ?(H): Data is abnormally high ? ?ASSESSMENT / PLAN / RECOMMENDATIONS:  ? ?1) Type *** Diabetes Mellitus, ***controlled, With*** complications - Most recent A1c of *** %. Goal A1c < *** %.  *** ? ?Plan: ?GENERAL: ?*** ? ?MEDICATIONS: ?*** ? ?EDUCATION / INSTRUCTIONS: ?BG monitoring instructions: Patient is instructed to check his blood sugars *** times a day, ***. ?Call [facility] if: BG persistently < 70 or > 300. ?I reviewed the Rule of 15 for the treatment of hypoglycemia in detail with the patient. Literature supplied. ? ? ?2) Diabetic complications:  ?Eye: Does *** have known diabetic retinopathy.  ?Neuro/ Feet: Does *** have known diabetic peripheral neuropathy. ?Renal: Patient does *** have known baseline CKD. He is *** on an ACEI/ARB at present.Check urine albumin/creatinine ratio yearly starting at time of diagnosis. If albuminuria is positive, treatment  is geared toward better glucose, blood pressure control and use of ACE inhibitors or ARBs. Monitor electrolytes and creatinine once to twice yearly. ? ? ?3) Lipids: Patient is *** on a statin.  ? ? ?4) Hypertension: ***  at goal of < 140/90 mmHg.  ? ? ? ? ? ?Signed electronically by: ?Abby Raelyn Mora, MD ? ?[facility]  ?[facility] ?[address] ?[place], [place] [address] ?Phone: [phone] ?FAX: [phone]  ? ?CC: ?Pcp, No ?No address on file ?Phone: None  ?Fax: None ? ? ? ?Return to Endocrinology clinic as below: ?Future  Appointments  ?Date Time Provider Department Center  ?02/06/2022 10:50 AM Drago Hammonds, Konrad DoloresIbtehal Jaralla, MD [facility] None  ?  ? ?

## 2022-02-07 NOTE — Progress Notes (Signed)
?Name: Vernon Floyd  ?MRN/ DOB: [id number], 04/19/1971   ?Age/ Sex: 51 y.o., male   ? ?PCP: Pcp, No   ?Reason for Endocrinology Evaluation: Type 2 Diabetes Mellitus  ?   ?Date of Initial Endocrinology Visit: 02/08/2022   ? ? ?PATIENT IDENTIFIER: Mr. Vernon Floyd is a 51 y.o. male with a past medical history of T2DM, HTN, and dyslipidemia. The patient presented for initial endocrinology clinic visit on 02/08/2022 for consultative assistance with his diabetes management.  ? ? ?HPI: ?Mr. Vernon Floyd was  ? ? ?Diagnosed with DM 2013 ?Prior Medications tried/Intolerance: Glipizide, metformin- sick to the stomach . Developed GI side effects to higher dose of Ozempic.  ?Currently checking blood sugars 0 x / day ?Hypoglycemia episodes : no             ?Hemoglobin A1c has ranged from 9.4% in 2020, peaking at 11.0% in 2021. ?Patient required assistance for hypoglycemia: no ?Patient has required hospitalization within the last 1 year from hyper or hypoglycemia: no ? ?In terms of diet, the patient eats mostly snacks. Drinks sweet tea, and sodas  ? ?Has occasional right 5th toe tingling  ?Denies nausea, vomiting but had a recent episode of  diarrhea last week  ? ?HOME DIABETES REGIMEN: ?Ozempic 1 mg weekly- not taking  ? ? ?Statin: Yes ?ACE-I/ARB: Yes ? ? ? ?METER DOWNLOAD SUMMARY: did not bring  ? ? ? ?DIABETIC COMPLICATIONS: ?Microvascular complications:  ?Neuropathy  ?Denies: CKD ?Last eye exam: never  ? ?Macrovascular complications:  ? ?Denies: CAD, PVD, CVA ? ? ?PAST HISTORY: ?Past Medical History:  ?Past Medical History:  ?Diagnosis Date  ? Diabetes mellitus without complication (HCC)   ? Hypertension   ? Obesity   ? ?Past Surgical History:  ?Past Surgical History:  ?Procedure Laterality Date  ? KNEE ARTHROSCOPY    ?  ?Social History:  reports that he has never smoked. He has never used smokeless tobacco. He reports current alcohol use. He reports that he does not currently use drugs after having used the following drugs:  Marijuana. ?Family History: No family history on file. ? ? ?HOME MEDICATIONS: ?Allergies as of 02/08/2022   ?No Known Allergies ?  ? ?  ?Medication List  ?  ? ?  ? Accurate as of February 08, 2022  4:49 PM. If you have any questions, ask your nurse or doctor.  ?  ?  ? ?  ? ?STOP taking these medications   ? ?doxycycline 100 MG capsule ?Commonly known as: VIBRAMYCIN ?Stopped by: Scarlette ShortsIbtehal J Jerae Izard, MD ?  ?Ozempic (1 MG/DOSE) 4 MG/3ML Sopn ?Generic drug: Semaglutide (1 MG/DOSE) ?Replaced by: Ozempic (0.25 or 0.5 MG/DOSE) 2 MG/3ML Sopn ?Stopped by: Scarlette ShortsIbtehal J Keira Bohlin, MD ?  ? ?  ? ?TAKE these medications   ? ?atorvastatin 20 MG tablet ?Commonly known as: LIPITOR ?Take 1 tablet (20 mg total) by mouth daily. ?  ?FreeStyle Libre 2 Sensor Misc ?1 Device by Does not apply route every 14 (fourteen) days. ?Started by: Scarlette ShortsIbtehal J Buffy Ehler, MD ?  ?glipiZIDE 5 MG tablet ?Commonly known as: GLUCOTROL ?Take 1 tablet (5 mg total) by mouth 2 (two) times daily before a meal. ?Started by: Scarlette ShortsIbtehal J Judia Arnott, MD ?  ?Insulin Pen Needle 31G X 5 MM Misc ?1 each by Does not apply route at bedtime. ?  ?lisinopril 5 MG tablet ?Commonly known as: ZESTRIL ?Take 1 tablet (5 mg total) by mouth daily. ?  ?methocarbamol 500 MG tablet ?Commonly known as: ROBAXIN ?Take 1 tablet (  500 mg total) by mouth 2 (two) times daily. ?  ?Ozempic (0.25 or 0.5 MG/DOSE) 2 MG/3ML Sopn ?Generic drug: Semaglutide(0.25 or 0.5MG /DOS) ?Inject 0.5 mg into the skin once a week. ?Replaces: Ozempic (1 MG/DOSE) 4 MG/3ML Sopn ?Started by: Scarlette Shorts, MD ?  ?PRESCRIPTION MEDICATION ?Take 1 tablet by mouth daily. Cholesterol ?  ? ?  ? ? ? ?ALLERGIES: ?No Known Allergies ? ? ?REVIEW OF SYSTEMS: ?A comprehensive ROS was conducted with the patient and is negative except as per HPI and below:  ?ROS ? ?  ?OBJECTIVE:  ? ?VITAL SIGNS: Ht 5\' 11"  (1.803 m)   Wt 267 lb (121.1 kg)   BMI 37.24 kg/m?   ? ?PHYSICAL EXAM:  ?General: Pt appears well and is in NAD  ?Neck:  General: Supple without adenopathy or carotid bruits. ?Thyroid: Thyroid size normal.  No goiter or nodules appreciated.   ?Lungs: Clear with good BS bilat with no rales, rhonchi, or wheezes  ?Heart: RRR with normal S1 and S2 and no gallops; no murmurs; no rub  ?Abdomen: Normoactive bowel sounds, soft, nontender, without masses or organomegaly palpable  ?Extremities:  ?Lower extremities - No pretibial edema. No lesions.  ?Neuro: MS is good with appropriate affect, pt is alert and Ox3  ? ? ?DM foot exam: 02/08/2022 ? ?The skin of the feet is intact without sores or ulcerations. ?The pedal pulses are 2+ on right and 2+ on left. ?The sensation is intact to a screening 5.07, 10 gram monofilament bilaterally ? ? ?DATA REVIEWED: ? ?Lab Results  ?Component Value Date  ? HGBA1C 10.1 (A) 02/08/2022  ? HGBA1C 11.0 (A) 08/18/2020  ? HGBA1C 9.4 (A) 09/08/2019  ? ? Latest Reference Range & Units 11/17/21 03:23  ?Sodium 135 - 145 mmol/L 131 (L)  ?Potassium 3.5 - 5.1 mmol/L 3.9  ?Chloride 98 - 111 mmol/L 99  ?CO2 22 - 32 mmol/L 24  ?Glucose 70 - 99 mg/dL 11/19/21 (H)  ?BUN 6 - 20 mg/dL 16  ?Creatinine 0.61 - 1.24 mg/dL 798  ?Calcium 8.9 - 10.3 mg/dL 8.3 (L)  ?Anion gap 5 - 15  8  ?Alkaline Phosphatase 38 - 126 U/L 65  ?Albumin 3.5 - 5.0 g/dL 3.4 (L)  ?AST 15 - 41 U/L 24  ?ALT 0 - 44 U/L 37  ?Total Protein 6.5 - 8.1 g/dL 7.3  ?Total Bilirubin 0.3 - 1.2 mg/dL 0.4  ?GFR, Estimated >60 mL/min >60  ? ?ASSESSMENT / PLAN / RECOMMENDATIONS:  ? ?1) Type 2 Diabetes Mellitus, poorly controlled, With out complications - Most recent A1c of 10.1%. Goal A1c <7.0%.   ? ?Plan: ?GENERAL: ?I have discussed with the patient the pathophysiology of diabetes. We went over the natural progression of the disease.  We stressed the importance of lifestyle changes including diet and exercise. I explained the complications associated with diabetes including retinopathy, nephropathy, neuropathy as well as increased risk of cardiovascular disease. We went over the  benefit seen with glycemic control.  ? ?I explained to the patient that diabetic patients are at higher than normal risk for amputations.  ?Patient would like to avoid insulin at this time ?I offered SGLT2 inhibitors, but he would like to try Ozempic again ?He did well on the 0.5 mg of Ozempic but developed GI side effects to the 1 mg dose ?I have recommended starting glipizide, cautioned against hypoglycemia, he was advised to take it before meals ?Patient is interested in CGM technology, freestyle 9.21 have been sent to the pharmacy ? ?  MEDICATIONS: ?Start Ozempic 0.5 mg weekly ?Start glipizide 5 mg twice daily ? ?EDUCATION / INSTRUCTIONS: ?BG monitoring instructions: Patient is instructed to check his blood sugars 3 times a day, before meals . ?Call [facility] if: BG persistently < 70  ?I reviewed the Rule of 15 for the treatment of hypoglycemia in detail with the patient. Literature supplied. ? ? ?2) Diabetic complications:  ?Eye: Does not have known diabetic retinopathy.  ?Neuro/ Feet: Does not have known diabetic peripheral neuropathy. ?Renal: Patient does not have known baseline CKD. He is  on an ACEI/ARB at present. ? ?3) Dyslipidemia:  ? ?-LDL above goal, he has been out of atorvastatin ?-Discussed cardiovascular benefits of statin therapy and I have encouraged him to restart his medications again ?-Refill was sent to the pharmacy ? ? ?Medication ?Continue atorvastatin 20 mg daily ? ? ?Follow-up 4 months ? ? ?Signed electronically by: ?Abby Raelyn Mora, MD ? ?[facility]  ?[facility] ?[address] ?[place], [place] [address] ?Phone: [phone] ?FAX: [phone]  ? ?CC: ?Pcp, No ?No address on file ?Phone: None  ?Fax: None ? ? ? ?Return to Endocrinology clinic as below: ?Future Appointments  ?Date Time Provider Department Center  ?06/19/2022  7:30 AM Drako Maese, Konrad Dolores, MD [facility] None  ?  ? ?

## 2022-02-08 ENCOUNTER — Ambulatory Visit (INDEPENDENT_AMBULATORY_CARE_PROVIDER_SITE_OTHER): Payer: BLUE CROSS/BLUE SHIELD | Admitting: Internal Medicine

## 2022-02-08 ENCOUNTER — Encounter: Payer: Self-pay | Admitting: Internal Medicine

## 2022-02-08 VITALS — Ht 71.0 in | Wt 267.0 lb

## 2022-02-08 DIAGNOSIS — E1165 Type 2 diabetes mellitus with hyperglycemia: Secondary | ICD-10-CM | POA: Diagnosis not present

## 2022-02-08 DIAGNOSIS — E785 Hyperlipidemia, unspecified: Secondary | ICD-10-CM | POA: Diagnosis not present

## 2022-02-08 DIAGNOSIS — R739 Hyperglycemia, unspecified: Secondary | ICD-10-CM

## 2022-02-08 LAB — POCT GLYCOSYLATED HEMOGLOBIN (HGB A1C): Hemoglobin A1C: 10.1 % — AB (ref 4.0–5.6)

## 2022-02-08 LAB — POCT GLUCOSE (DEVICE FOR HOME USE): Glucose Fasting, POC: 239 mg/dL — AB (ref 70–99)

## 2022-02-08 MED ORDER — GLIPIZIDE 5 MG PO TABS: 5.0000 mg | ORAL_TABLET | Freq: Two times a day (BID) | ORAL | 1 refills | Status: DC

## 2022-02-08 MED ORDER — OZEMPIC (0.25 OR 0.5 MG/DOSE) 2 MG/3ML ~~LOC~~ SOPN: 0.5000 mg | PEN_INJECTOR | SUBCUTANEOUS | 3 refills | Status: DC

## 2022-02-08 MED ORDER — FREESTYLE LIBRE 2 SENSOR MISC: 1.0000 | 3 refills | Status: AC

## 2022-02-08 MED ORDER — ATORVASTATIN CALCIUM 20 MG PO TABS: 20.0000 mg | ORAL_TABLET | Freq: Every day | ORAL | 3 refills | Status: AC

## 2022-02-08 NOTE — Patient Instructions (Signed)
-   Start Ozempic 0.25 mg Weekly  for 6 weeks, After that increase 0.5 my weekly  ?- Start Glipizide 5 mg, 1 tablet Before first meal of the day and 1 tablet before last meal of the day  ? ? ? ? ?HOW TO TREAT LOW BLOOD SUGARS (Blood sugar LESS THAN 70 MG/DL) ?Please follow the RULE OF 15 for the treatment of hypoglycemia treatment (when your (blood sugars are less than 70 mg/dL)  ? ?STEP 1: Take 15 grams of carbohydrates when your blood sugar is low, which includes:  ?3-4 GLUCOSE TABS  OR ?3-4 OZ OF JUICE OR REGULAR SODA OR ?ONE TUBE OF GLUCOSE GEL   ? ?STEP 2: RECHECK blood sugar in 15 MINUTES ?STEP 3: If your blood sugar is still low at the 15 minute recheck --> then, go back to STEP 1 and treat AGAIN with another 15 grams of carbohydrates. ? ?

## 2022-02-09 ENCOUNTER — Other Ambulatory Visit (HOSPITAL_COMMUNITY): Payer: Self-pay

## 2022-02-09 ENCOUNTER — Telehealth: Payer: Self-pay

## 2022-02-09 NOTE — Telephone Encounter (Signed)
Patient Advocate Encounter ? ?Prior Authorization for Ozempic 2mg /109ml pen injectors has been approved.   ? ?PA# N/A ? ?Effective dates: 02/09/22 through 02/08/23 ? ?Spoke with Pharmacy to Process. ? ?Patient Advocate ?Fax: [phone]  ?

## 2022-03-01 ENCOUNTER — Telehealth: Payer: Self-pay

## 2022-03-01 MED ORDER — GLIPIZIDE 5 MG PO TABS: 5.0000 mg | ORAL_TABLET | Freq: Two times a day (BID) | ORAL | 1 refills | Status: AC

## 2022-03-01 NOTE — Telephone Encounter (Signed)
Patient notified

## 2022-03-01 NOTE — Telephone Encounter (Signed)
Patient states that the Ozempic was still $700 dollars with the card. Also he lost his Glipizide and wants to know if we can write for him to get the script earlier than 03/10/22. ?

## 2022-06-19 ENCOUNTER — Ambulatory Visit: Payer: BLUE CROSS/BLUE SHIELD | Admitting: Internal Medicine

## 2023-02-15 ENCOUNTER — Other Ambulatory Visit (HOSPITAL_COMMUNITY): Payer: Self-pay

## 2023-02-16 ENCOUNTER — Emergency Department (HOSPITAL_BASED_OUTPATIENT_CLINIC_OR_DEPARTMENT_OTHER)
Admission: EM | Admit: 2023-02-16 | Discharge: 2023-02-16 | Disposition: A | Discharge status: Home / Self Care | Payer: Self-pay | Attending: Emergency Medicine | Admitting: Emergency Medicine

## 2023-02-16 ENCOUNTER — Other Ambulatory Visit: Payer: Self-pay

## 2023-02-16 ENCOUNTER — Encounter (HOSPITAL_BASED_OUTPATIENT_CLINIC_OR_DEPARTMENT_OTHER): Payer: Self-pay | Admitting: Emergency Medicine

## 2023-02-16 DIAGNOSIS — I10 Essential (primary) hypertension: Secondary | ICD-10-CM | POA: Insufficient documentation

## 2023-02-16 DIAGNOSIS — M25511 Pain in right shoulder: Secondary | ICD-10-CM | POA: Insufficient documentation

## 2023-02-16 DIAGNOSIS — Z202 Contact with and (suspected) exposure to infections with a predominantly sexual mode of transmission: Secondary | ICD-10-CM | POA: Insufficient documentation

## 2023-02-16 DIAGNOSIS — E119 Type 2 diabetes mellitus without complications: Secondary | ICD-10-CM | POA: Insufficient documentation

## 2023-02-16 LAB — URINALYSIS, ROUTINE W REFLEX MICROSCOPIC
Bilirubin Urine: NEGATIVE
Glucose, UA: >=500 mg/dL — AB
Hgb urine dipstick: NEGATIVE
Ketones, ur: NEGATIVE mg/dL
Leukocytes,Ua: NEGATIVE
Nitrite: NEGATIVE
Protein, ur: NEGATIVE mg/dL
Specific Gravity, Urine: 1.025 (ref 1.005–1.030)
pH: 6 (ref 5.0–8.0)

## 2023-02-16 LAB — URINALYSIS, MICROSCOPIC (REFLEX)
RBC / HPF: NONE SEEN RBC/hpf (ref 0–5)
WBC, UA: NONE SEEN WBC/hpf (ref 0–5)

## 2023-02-16 LAB — HIV ANTIBODY (ROUTINE TESTING W REFLEX): HIV Screen 4th Generation wRfx: NONREACTIVE

## 2023-02-16 MED ORDER — DOXYCYCLINE HYCLATE 100 MG PO TABS
100.0000 mg | ORAL_TABLET | Freq: Once | ORAL | Status: AC
  Administered 2023-02-16: 100 mg via ORAL
  Filled 2023-02-16: qty 1

## 2023-02-16 MED ORDER — LIDOCAINE HCL (PF) 1 % IJ SOLN
INTRAMUSCULAR | Status: AC
  Administered 2023-02-16: 1 mL
  Filled 2023-02-16: qty 5

## 2023-02-16 MED ORDER — CEFTRIAXONE SODIUM 500 MG IJ SOLR
500.0000 mg | Freq: Once | INTRAMUSCULAR | Status: AC
  Administered 2023-02-16: 500 mg via INTRAMUSCULAR
  Filled 2023-02-16: qty 500

## 2023-02-16 MED ORDER — DOXYCYCLINE HYCLATE 100 MG PO CAPS: 100.0000 mg | ORAL_CAPSULE | Freq: Two times a day (BID) | ORAL | 0 refills | Status: AC

## 2023-02-16 NOTE — ED Provider Notes (Signed)
Emergency Department Provider Note   I have reviewed the triage vital signs and [facility] notes.   HISTORY  Chief Complaint Exposure to STD   HPI Vernon Floyd is a 52 y.o. male with a past history of diabetes and hypertension presents emergency department after being told he had been exposed to a sexually-transmitted infection.  He is not having any symptoms.  This person called him and and is apparently angry and did not tell him what exact infection he had been exposed to but that he needed to get tested.  Denies any urethral discharge or pain.  He urinates frequently with his diabetes but no other symptoms.  No rashes.  He is also having some mild pain in the right shoulder and right lower leg which has been going on previously which he mentions on review of systems.  No joint inflammation or swelling.  No fevers.    Past Medical History:  Diagnosis Date   Diabetes mellitus without complication    Hypertension    Obesity     Review of Systems  Constitutional: No fever/chills Cardiovascular: Denies chest pain. Respiratory: Denies shortness of breath. Gastrointestinal: No abdominal pain.  No nausea, no vomiting.  No diarrhea.  No constipation. Genitourinary: Negative for dysuria. Musculoskeletal: Negative for back pain. Positive right shoulder and leg pain.  Skin: Negative for rash. Neurological: Negative for headaches, focal weakness or numbness.   ____________________________________________   PHYSICAL EXAM:  VITAL SIGNS: ED Triage Vitals  Enc Vitals Group     BP 02/16/23 1439 (!) 138/95     Pulse Rate 02/16/23 1439 98     Resp 02/16/23 1439 14     Temp 02/16/23 1439 98.7 F (37.1 C)     Temp Source 02/16/23 1439 Oral     SpO2 02/16/23 1439 97 %     Weight 02/16/23 1437 266 lb 15.6 oz (121.1 kg)     Height 02/16/23 1437  (1.803 m)   Constitutional: Alert and oriented. Well appearing and in no acute distress. Eyes: Conjunctivae are normal.  Head:  Atraumatic. Nose: No congestion/rhinnorhea. Mouth/Throat: Mucous membranes are moist.  Neck: No stridor.   Cardiovascular: Normal rate, regular rhythm. Good peripheral circulation. Grossly normal heart sounds.   Respiratory: Normal respiratory effort.  No retractions. Lungs CTAB. Gastrointestinal: Soft and nontender. No distention.  Musculoskeletal: No lower extremity tenderness nor edema. No gross deformities of extremities.  Normal range of motion of the right shoulder with some mild discomfort reproducing the pain.  No joint effusion or warmth.  Soft compartments in the right lower extremity.  No reduced range of motion of the right knee or ankle. No effusion.  Neurologic:  Normal speech and language. No gross focal neurologic deficits are appreciated.  Skin:  Skin is warm, dry and intact. No rash noted.   ____________________________________________   LABS (all labs ordered are listed, but only abnormal results are displayed)  Labs Reviewed  URINALYSIS, ROUTINE W REFLEX MICROSCOPIC - Abnormal; Notable for the following components:      Result Value   Glucose, UA >=500 (*)    All other components within normal limits  URINALYSIS, MICROSCOPIC (REFLEX) - Abnormal; Notable for the following components:   Bacteria, UA RARE (*)    All other components within normal limits  HIV ANTIBODY (ROUTINE TESTING W REFLEX)  RPR  GC/CHLAMYDIA PROBE AMP ([place]) NOT AT [facility]    ____________________________________________   PROCEDURES  Procedure(s) performed:   Procedures  None  ____________________________________________  INITIAL IMPRESSION / ASSESSMENT AND PLAN / ED COURSE  Pertinent labs & imaging results that were available during my care of the patient were reviewed by me and considered in my medical decision making (see chart for details).   This patient is Presenting for Evaluation of STI exposure, which does require a range of treatment options, and is a complaint that  involves a moderate risk of morbidity and mortality.  The Differential Diagnoses include STI, arthritis, septic arthritis, etc.   Clinical Laboratory Tests Ordered, included UA unremarkable. HIV, RPR, STI panel pending.    Medical Decision Making: Summary:  Presents emergency department after exposure to STI but unknown which type.  He is asymptomatic.  Will send STI panel including HIV and RPR.  He will follow the test results in the MyChart app.  Advised abstinence versus barrier contraception until completing his course of doxycycline.  He will try and determine which infection he was exposed to but advise he complete this course of antibiotics anyway.   Also having some likely unrelated right shoulder and right lower leg pain which been going on before the potential exposure.  No evidence of septic joint or reactive arthritis on my exam.  Plan to refer to sports medicine as an outpatient.   Patient's presentation is most consistent with acute, uncomplicated illness.   Disposition: discharge  ____________________________________________  FINAL CLINICAL IMPRESSION(S) / ED DIAGNOSES  Final diagnoses:  STD exposure  Acute pain of right shoulder     NEW OUTPATIENT MEDICATIONS STARTED DURING THIS VISIT:  New Prescriptions   DOXYCYCLINE (VIBRAMYCIN) 100 MG CAPSULE    Take 1 capsule (100 mg total) by mouth 2 (two) times daily for 7 days.    Note:  This document was prepared using Dragon voice recognition software and may include unintentional dictation errors.  Alona Bene, MD, [facility]    Secret Kristensen, Arlyss Repress, MD 02/16/23 [phone]

## 2023-02-16 NOTE — Discharge Instructions (Signed)

## 2023-02-16 NOTE — ED Triage Notes (Signed)
States exposure to STD doesn't know which one senies any s/s

## 2023-02-17 LAB — RPR: RPR Ser Ql: NONREACTIVE

## 2023-02-19 LAB — GC/CHLAMYDIA PROBE AMP (~~LOC~~) NOT AT ARMC
Chlamydia: NEGATIVE
Comment: NEGATIVE
Comment: NORMAL
Neisseria Gonorrhea: NEGATIVE

## 2023-04-23 ENCOUNTER — Other Ambulatory Visit: Payer: Self-pay

## 2023-04-23 ENCOUNTER — Emergency Department (HOSPITAL_BASED_OUTPATIENT_CLINIC_OR_DEPARTMENT_OTHER): Payer: Self-pay

## 2023-04-23 ENCOUNTER — Emergency Department (HOSPITAL_BASED_OUTPATIENT_CLINIC_OR_DEPARTMENT_OTHER)
Admission: EM | Admit: 2023-04-23 | Discharge: 2023-04-23 | Disposition: A | Discharge status: Home / Self Care | Payer: Self-pay | Attending: Emergency Medicine | Admitting: Emergency Medicine

## 2023-04-23 ENCOUNTER — Encounter (HOSPITAL_BASED_OUTPATIENT_CLINIC_OR_DEPARTMENT_OTHER): Payer: Self-pay | Admitting: Emergency Medicine

## 2023-04-23 DIAGNOSIS — I10 Essential (primary) hypertension: Secondary | ICD-10-CM | POA: Insufficient documentation

## 2023-04-23 DIAGNOSIS — Z79899 Other long term (current) drug therapy: Secondary | ICD-10-CM | POA: Insufficient documentation

## 2023-04-23 DIAGNOSIS — E119 Type 2 diabetes mellitus without complications: Secondary | ICD-10-CM | POA: Insufficient documentation

## 2023-04-23 DIAGNOSIS — Z7984 Long term (current) use of oral hypoglycemic drugs: Secondary | ICD-10-CM | POA: Insufficient documentation

## 2023-04-23 DIAGNOSIS — Z794 Long term (current) use of insulin: Secondary | ICD-10-CM | POA: Insufficient documentation

## 2023-04-23 DIAGNOSIS — M25512 Pain in left shoulder: Secondary | ICD-10-CM | POA: Insufficient documentation

## 2023-04-23 LAB — CBC
HCT: 49.1 % (ref 39.0–52.0)
Hemoglobin: 16.6 g/dL (ref 13.0–17.0)
MCH: 30.8 pg (ref 26.0–34.0)
MCHC: 33.8 g/dL (ref 30.0–36.0)
MCV: 91.1 fL (ref 80.0–100.0)
Platelets: 309 10*3/uL (ref 150–400)
RBC: 5.39 MIL/uL (ref 4.22–5.81)
RDW: 14.2 % (ref 11.5–15.5)
WBC: 8.9 10*3/uL (ref 4.0–10.5)
nRBC: 0 % (ref 0.0–0.2)

## 2023-04-23 LAB — BASIC METABOLIC PANEL
Anion gap: 8 (ref 5–15)
BUN: 17 mg/dL (ref 6–20)
CO2: 25 mmol/L (ref 22–32)
Calcium: 9.4 mg/dL (ref 8.9–10.3)
Chloride: 100 mmol/L (ref 98–111)
Creatinine, Ser: 1.13 mg/dL (ref 0.61–1.24)
GFR, Estimated: >60 mL/min (ref >60)
Glucose, Bld: 189 mg/dL — ABNORMAL HIGH (ref 70–99)
Potassium: 4.2 mmol/L (ref 3.5–5.1)
Sodium: 133 mmol/L — ABNORMAL LOW (ref 135–145)

## 2023-04-23 LAB — CBG MONITORING, ED: Glucose-Capillary: 196 mg/dL — ABNORMAL HIGH (ref 70–99)

## 2023-04-23 LAB — TROPONIN I (HIGH SENSITIVITY): Troponin I (High Sensitivity): 4 ng/L (ref <18)

## 2023-04-23 MED ORDER — ASPIRIN 81 MG PO CHEW
324.0000 mg | CHEWABLE_TABLET | ORAL | Status: AC
  Administered 2023-04-23: 324 mg via ORAL
  Filled 2023-04-23: qty 4

## 2023-04-23 MED ORDER — ACETAMINOPHEN 500 MG PO TABS
1000.0000 mg | ORAL_TABLET | ORAL | Status: AC
  Administered 2023-04-23: 1000 mg via ORAL
  Filled 2023-04-23: qty 2

## 2023-04-23 NOTE — ED Triage Notes (Signed)
Patient arrived via POV c/o left arm pain x 1 day. Patient states pain feels like "cramping". Patient is AO x 4, VS WDL, normal gait

## 2023-04-23 NOTE — ED Provider Notes (Signed)
[facility] Provider Note   CSN: [id number] Arrival date & time: 04/23/23  2119     History  No chief complaint on file.   [facility] is a 52 y.o. male.  53 year old male with a history of diabetes, hypertension, and obesity presents emergency department with left arm pain.  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  Thinks it may be from lifting boxes.  No other injuries.  Says that it goes from his shoulder up to his neck and occasionally across his chest.  Is worse with moving his arm.  Is not exertional.  No nausea, vomiting, diaphoresis, shortness of breath, or dizziness.  No personal or family history of MI.  Says that he is concerned about a possible heart attack after talking to several other individuals so came to the emergency department for evaluation.  Says that the pain has been constant since last night.       Home Medications Prior to Admission medications   Medication Sig Start Date End Date Taking? Authorizing Provider  atorvastatin (LIPITOR) 20 MG tablet Take 1 tablet (20 mg total) by mouth daily. 02/08/22   Shamleffer, Konrad Dolores, MD  Continuous Blood Gluc Sensor (FREESTYLE LIBRE 2 SENSOR) MISC 1 Device by Does not apply route every 14 (fourteen) days. 02/08/22   Shamleffer, Konrad Dolores, MD  glipiZIDE (GLUCOTROL) 5 MG tablet Take 1 tablet (5 mg total) by mouth 2 (two) times daily before a meal. 03/01/22   Shamleffer, Konrad Dolores, MD  Insulin Pen Needle 31G X 5 MM MISC 1 each by Does not apply route at bedtime. Patient not taking: Reported on 02/08/2022 09/08/19   Hoy Register, MD  lisinopril (ZESTRIL) 5 MG tablet Take 1 tablet (5 mg total) by mouth daily. 08/18/20 09/17/20  Hoy Register, MD  methocarbamol (ROBAXIN) 500 MG tablet Take 1 tablet (500 mg total) by mouth 2 (two) times daily. Patient not taking: Reported on 02/08/2022  02/25/20   Gailen Shelter, PA  PRESCRIPTION MEDICATION Take 1 tablet by mouth daily. Cholesterol Patient not taking: Reported on 02/08/2022    [provider]      Allergies    Patient has no known allergies.    Review of Systems   Review of Systems  Physical Exam Updated Vital Signs BP (!) 130/101   Pulse 87   Temp 98 F (36.7 C)   Resp 20   Ht 5\' 11"  (1.803 m)   Wt 122.5 kg   SpO2 96%   BMI 37.66 kg/m  Physical Exam Vitals and nursing note reviewed.  Constitutional:      General: He is not in acute distress.    Appearance: He is well-developed.  HENT:     Head: Normocephalic and atraumatic.     Right Ear: External ear normal.     Left Ear: External ear normal.     Nose: Nose normal.  Eyes:     Extraocular Movements: Extraocular movements intact.     Conjunctiva/sclera: Conjunctivae normal.     Pupils: Pupils are equal, round, and reactive to light.  Cardiovascular:     Rate and Rhythm: Normal rate and regular rhythm.     Heart sounds: Normal heart sounds.     Comments: Chest pain not reproducible Pulmonary:     Effort: Pulmonary effort is normal. No respiratory distress.     Breath sounds: Normal breath sounds.  Musculoskeletal:     Cervical back: Normal range of motion and neck supple.     Right lower leg: No edema.     Left lower leg: No edema.     Comments: Tenderness to palpation of left trapezius and left shoulder  Skin:    General: Skin is warm and dry.  Neurological:     Mental Status: He is alert. Mental status is at baseline.  Psychiatric:        Mood and Affect: Mood normal.        Behavior: Behavior normal.     ED Results / Procedures / Treatments   Labs (all labs ordered are listed, but only abnormal results are displayed) Labs Reviewed  BASIC METABOLIC PANEL - Abnormal; Notable for the following components:      Result Value   Sodium 133 (*)    Glucose, Bld 189 (*)    All other components within normal limits  CBG MONITORING,  ED - Abnormal; Notable for the following components:   Glucose-Capillary 196 (*)    All other components within normal limits  CBC  TROPONIN I (HIGH SENSITIVITY)    EKG EKG Interpretation  Date/Time:  Monday April 23 2023 22:26:51 EDT Ventricular Rate:  78 PR Interval:  172 QRS Duration: 98 QT Interval:  374 QTC Calculation: 426 R Axis:   32 Text Interpretation: Sinus rhythm Probable left atrial enlargement Confirmed by Vonita Moss [id number] on 04/23/2023 11:11:52 PM  Radiology DG Chest 2 View  Result Date: 04/23/2023 CLINICAL DATA:  Left upper extremity pain radiating across the chest. No known trauma history. EXAM: LEFT SHOULDER - 2+ VIEW; CHEST - 2 VIEW COMPARISON:  PA Lat chest 03/03/2018 FINDINGS: PA and lateral chest: The lungs are clear. No substantial pleural effusion is seen. The cardiomediastinal silhouette and vascular pattern are normal. The thoracic cage is intact. Left shoulder, routine three views: Normal bone mineralization. There is no evidence of fracture, dislocation or arthritic changes. There is a small calcific body just above the glenoid process of the scapula, which could be a loose body in the superior glenohumeral joint space or could be a dystrophic calcification in the adjacent musculature, but it is unlikely of acute significance. IMPRESSION: 1. No evidence of acute chest disease. 2. Small calcific body just above the glenoid process of the scapula, which could be a loose body in the superior glenohumeral joint space or could be a dystrophic calcification in the adjacent musculature. Electronically Signed   By: Almira Bar M.D.   On: 04/23/2023 22:38   DG Shoulder Left  Result Date: 04/23/2023 CLINICAL DATA:  Left upper extremity pain radiating across the chest. No known trauma history. EXAM: LEFT SHOULDER - 2+ VIEW; CHEST - 2 VIEW COMPARISON:  PA Lat chest 03/03/2018 FINDINGS: PA and lateral chest: The lungs are clear. No substantial pleural effusion is  seen. The cardiomediastinal silhouette and vascular pattern are normal. The thoracic cage is intact. Left shoulder, routine three views: Normal bone mineralization. There is no evidence of fracture, dislocation or arthritic changes. There is a small calcific body just above the glenoid process of the scapula, which could be a loose body in the superior glenohumeral joint space or could be a dystrophic calcification in the adjacent musculature, but it is unlikely of acute significance. IMPRESSION: 1. No evidence of acute chest disease. 2. Small calcific body just above the glenoid process of the scapula, which could be a loose body in the superior glenohumeral joint space or could  be a dystrophic calcification in the adjacent musculature. Electronically Signed   By: Almira Bar M.D.   On: 04/23/2023 22:38    Procedures Procedures    Medications Ordered in ED Medications  aspirin chewable tablet 324 mg (324 mg Oral Given 04/23/23 2223)  acetaminophen (TYLENOL) tablet 1,000 mg (1,000 mg Oral Given 04/23/23 2224)    ED Course/ Medical Decision Making/ A&P                             Medical Decision Making Amount and/or Complexity of Data Reviewed Labs: ordered. Radiology: ordered.  Risk OTC drugs.   [facility] is a 52 y.o. male with comorbidities that complicate the patient evaluation including  diabetes, hypertension, and obesity presents emergency department with left arm pain    Initial Ddx:  Musculoskeletal pain, recent injury, cervical radiculopathy, MI  MDM/Course:  Feel the patient likely has MSK pain from an overuse injury given his work.  Patient did report that it radiated to his neck and his chest and does have some risk factors for MI and he states that he is concerned about a heart attack so we obtained an EKG and troponin that were unremarkable.  Since his symptoms have been present for over 24 hours do not feel that repeat troponin is warranted.  His x-ray did show  either calcification or loose body in his glenohumeral joint.  This again argues for MSK injury and will have him follow-up with orthopedics or sports medicine in several days.  Counseled on supportive care for his shoulder pain.  Return precautions discussed prior to discharge.   This patient presents to the ED for concern of complaints listed in HPI, this involves an extensive number of treatment options, and is a complaint that carries with it a high risk of complications and morbidity. Disposition including potential need for admission considered.   Dispo: DC Home. Return precautions discussed including, but not limited to, those listed in the AVS. Allowed pt time to ask questions which were answered fully prior to dc.  Records reviewed Outpatient Clinic Notes The following labs were independently interpreted: Chemistry and show no acute abnormality I independently reviewed the following imaging with scope of interpretation limited to determining acute life threatening conditions related to emergency care: Chest x-ray and agree with the radiologist interpretation with the following exceptions: none I personally reviewed and interpreted cardiac monitoring: normal sinus rhythm  I personally reviewed and interpreted the pt's EKG: see above for interpretation  I have reviewed the patients home medications and made adjustments as needed        Final Clinical Impression(s) / ED Diagnoses Final diagnoses:  Acute pain of left shoulder    Rx / DC Orders ED Discharge Orders     None         Rondel Baton, MD 04/23/23 2334

## 2023-04-23 NOTE — Discharge Instructions (Signed)
You were seen for your shoulder pain in the emergency department.   At home, please ice your shoulder and use Tylenol and ibuprofen for the pain.  You may also use over-the-counter lidocaine patches.    Check your MyChart online for the results of any tests that had not resulted by the time you left the emergency department.   Follow-up with an orthopedics or sports medicine doctor in 1 to 2 weeks.  Return immediately to the emergency department if you experience any of the following: Severe chest pain, vomiting, shortness of breath, or any other concerning symptoms.    Thank you for visiting our Emergency Department. It was a pleasure taking care of you today.

## 2023-04-23 NOTE — ED Notes (Signed)
Patient transported to X-ray 

## 2023-05-26 ENCOUNTER — Encounter (HOSPITAL_BASED_OUTPATIENT_CLINIC_OR_DEPARTMENT_OTHER): Payer: Self-pay | Admitting: Emergency Medicine

## 2023-05-26 ENCOUNTER — Emergency Department (HOSPITAL_BASED_OUTPATIENT_CLINIC_OR_DEPARTMENT_OTHER): Payer: No Typology Code available for payment source

## 2023-05-26 ENCOUNTER — Emergency Department (HOSPITAL_BASED_OUTPATIENT_CLINIC_OR_DEPARTMENT_OTHER)
Admission: EM | Admit: 2023-05-26 | Discharge: 2023-05-26 | Disposition: A | Discharge status: Home / Self Care | Payer: No Typology Code available for payment source | Attending: Emergency Medicine | Admitting: Emergency Medicine

## 2023-05-26 ENCOUNTER — Other Ambulatory Visit: Payer: Self-pay

## 2023-05-26 DIAGNOSIS — E119 Type 2 diabetes mellitus without complications: Secondary | ICD-10-CM | POA: Diagnosis not present

## 2023-05-26 DIAGNOSIS — Z79899 Other long term (current) drug therapy: Secondary | ICD-10-CM | POA: Insufficient documentation

## 2023-05-26 DIAGNOSIS — I10 Essential (primary) hypertension: Secondary | ICD-10-CM | POA: Diagnosis not present

## 2023-05-26 DIAGNOSIS — M791 Myalgia, unspecified site: Secondary | ICD-10-CM | POA: Insufficient documentation

## 2023-05-26 DIAGNOSIS — M546 Pain in thoracic spine: Secondary | ICD-10-CM | POA: Diagnosis not present

## 2023-05-26 DIAGNOSIS — Y92410 Unspecified street and highway as the place of occurrence of the external cause: Secondary | ICD-10-CM | POA: Diagnosis not present

## 2023-05-26 DIAGNOSIS — Z7984 Long term (current) use of oral hypoglycemic drugs: Secondary | ICD-10-CM | POA: Insufficient documentation

## 2023-05-26 HISTORY — DX: Type 2 diabetes mellitus without complications: E11.9

## 2023-05-26 MED ORDER — NAPROXEN 250 MG PO TABS
500.0000 mg | ORAL_TABLET | Freq: Once | ORAL | Status: AC
  Administered 2023-05-26: 500 mg via ORAL
  Filled 2023-05-26: qty 2

## 2023-05-26 MED ORDER — NAPROXEN 500 MG PO TABS: ORAL_TABLET | ORAL | 0 refills | Status: AC
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
